# Patient Record
Sex: Male | Born: 1955 | Race: White | Hispanic: No | Marital: Married | Smoking: Never smoker
Health system: Southern US, Community
[De-identification: ages and names within clinical notes are randomized; demographics above are authoritative.]

## PROBLEM LIST (undated history)

## (undated) DIAGNOSIS — T7840XA Allergy, unspecified, initial encounter: Secondary | ICD-10-CM

## (undated) DIAGNOSIS — K219 Gastro-esophageal reflux disease without esophagitis: Secondary | ICD-10-CM

## (undated) DIAGNOSIS — N138 Other obstructive and reflux uropathy: Secondary | ICD-10-CM

## (undated) DIAGNOSIS — N401 Enlarged prostate with lower urinary tract symptoms: Secondary | ICD-10-CM

## (undated) HISTORY — DX: Benign prostatic hyperplasia with lower urinary tract symptoms: N40.1

## (undated) HISTORY — DX: Gastro-esophageal reflux disease without esophagitis: K21.9

## (undated) HISTORY — PX: WISDOM TOOTH EXTRACTION: SHX21

## (undated) HISTORY — PX: TONSILLECTOMY: SUR1361

## (undated) HISTORY — DX: Allergy, unspecified, initial encounter: T78.40XA

## (undated) HISTORY — DX: Benign prostatic hyperplasia with lower urinary tract symptoms: N13.8

## (undated) HISTORY — PX: COLONOSCOPY: SHX174

---

## 2003-08-11 HISTORY — PX: TURP VAPORIZATION: SUR1397

## 2011-05-28 ENCOUNTER — Telehealth: Payer: Self-pay | Admitting: Family Medicine

## 2011-05-28 ENCOUNTER — Ambulatory Visit (INDEPENDENT_AMBULATORY_CARE_PROVIDER_SITE_OTHER): Payer: 59 | Admitting: Family Medicine

## 2011-05-28 ENCOUNTER — Encounter: Payer: Self-pay | Admitting: Family Medicine

## 2011-05-28 DIAGNOSIS — L989 Disorder of the skin and subcutaneous tissue, unspecified: Secondary | ICD-10-CM

## 2011-05-28 DIAGNOSIS — M62838 Other muscle spasm: Secondary | ICD-10-CM

## 2011-05-28 DIAGNOSIS — J069 Acute upper respiratory infection, unspecified: Secondary | ICD-10-CM

## 2011-05-28 DIAGNOSIS — N429 Disorder of prostate, unspecified: Secondary | ICD-10-CM | POA: Insufficient documentation

## 2011-05-28 DIAGNOSIS — Z0000 Encounter for general adult medical examination without abnormal findings: Secondary | ICD-10-CM

## 2011-05-28 DIAGNOSIS — J309 Allergic rhinitis, unspecified: Secondary | ICD-10-CM

## 2011-05-28 LAB — CBC WITH DIFFERENTIAL/PLATELET
Basophils Absolute: 0 10*3/uL (ref 0.0–0.1)
Basophils Relative: 0 % (ref 0–1)
Eosinophils Relative: 3 % (ref 0–5)
HCT: 47.7 % (ref 39.0–52.0)
Lymphocytes Relative: 10 % — ABNORMAL LOW (ref 12–46)
MCH: 32.3 pg (ref 26.0–34.0)
MCHC: 32.9 g/dL (ref 30.0–36.0)
MCV: 98.1 fL (ref 78.0–100.0)
Monocytes Absolute: 0.6 10*3/uL (ref 0.1–1.0)
RDW: 13.4 % (ref 11.5–15.5)

## 2011-05-28 LAB — COMPREHENSIVE METABOLIC PANEL
Albumin: 4.7 g/dL (ref 3.5–5.2)
Alkaline Phosphatase: 97 U/L (ref 39–117)
BUN: 20 mg/dL (ref 6–23)
Creat: 1.41 mg/dL — ABNORMAL HIGH (ref 0.50–1.35)
Glucose, Bld: 101 mg/dL — ABNORMAL HIGH (ref 70–99)
Potassium: 4.5 mEq/L (ref 3.5–5.3)
Total Bilirubin: 0.7 mg/dL (ref 0.3–1.2)

## 2011-05-28 LAB — LIPID PANEL
HDL: 54 mg/dL (ref >39)
LDL Cholesterol: 173 mg/dL — ABNORMAL HIGH (ref 0–99)
Triglycerides: 127 mg/dL (ref <150)

## 2011-05-28 MED ORDER — AZELASTINE HCL 0.1 % NA SOLN: 2.0000 | Freq: Two times a day (BID) | NASAL | Status: DC

## 2011-05-28 MED ORDER — PANTOPRAZOLE SODIUM 40 MG PO TBEC: 40.0000 mg | DELAYED_RELEASE_TABLET | Freq: Every day | ORAL | Status: DC

## 2011-05-28 MED ORDER — TAMSULOSIN HCL 0.4 MG PO CAPS: 0.4000 mg | ORAL_CAPSULE | Freq: Every day | ORAL | Status: DC

## 2011-05-28 NOTE — Patient Instructions (Addendum)
Patient has been on a PPI for [place] to renew it  t he will call with the name for [place] to continue prescribing it He will find a chiropracter to work on his neck and the muscle spasm Notify if URI gets worse Start flomax Evaluate skin lesions at PE one lesion he will need to return for removal

## 2011-05-28 NOTE — Telephone Encounter (Signed)
Pt's wife called and her husband (the pt) was seen this am and they were told to call back with the specific PPI med pt is on so we can send in to the pharmacy for the pt. Plan:  Pantoprazole 40 mg was entered in to the pt profile, and ordered and sent as #30/6 refills to the [facility].  Pt aware. Jarvis Newcomer, LPN [facility]

## 2011-05-28 NOTE — Progress Notes (Signed)
  Subjective:    Patient ID: Shawn Arellano, male    DOB: Mar 15, 1956, 55 y.o.   MRN: [id number]  HPI #1 reflux #2 Prostatic hypertrophy #3 musle spasm  #4 flu vaccination #5 skin lesions has had a skin lesion on neck removed that has returned Multiple skin lesions on back #6URI symptoms started 3 days ago accompanied w/nasal congestion and cough Review of Systems  HENT: Positive for congestion, rhinorrhea, postnasal drip and sinus pressure.   Eyes: Positive for pain.  Respiratory: Positive for cough.   Gastrointestinal:       Reflux  Genitourinary: Positive for urgency and enuresis.  Musculoskeletal: Positive for myalgias.       Neck stiffness  Skin: Positive for color change.   VS BP 134/93 T98.9 H 5 7  Wt 207    Objective:   Physical Exam  Constitutional: He is oriented to person, place, and time. He appears well-developed.  HENT:  Head: Normocephalic and atraumatic.  Neck: Neck supple.       Tenderness over the R trapezius muscle both bellies.  Neurological: He is alert and oriented to person, place, and time.  Skin: Skin is warm and dry.       Skin lesion on L side of neck  Psychiatric: He has a normal mood and affect.          Assessment & Plan:  #1 will continue on reflux medication #2 since leakage is starting will consider Flomax with the realization that at his PE even since the Flomax did not work before he had microwave ablation it may work now #3 gave Lobbyist or Chiropractor of his choice  #4 since URI will hold off flu vaccination #5 will need separate visit for skin lesion boipsy removal on neck  #6 IF not better by next week call may consider antibiotic at that time Due to prostatic hypertrophy will stay away from oral antihistamine  And will place on Astelin nasal spray may need to add flomax       Patient instructions  Patient has been on a PPI for [place] to renew it  t he will call with the name for [place] to continue prescribing it He  will find a chiropracter to work on his neck and the muscle spasm Notify if URI gets worse Start flomax Evaluate skin lesions at PE one lesion he will need to return for removal

## 2011-06-18 ENCOUNTER — Ambulatory Visit (INDEPENDENT_AMBULATORY_CARE_PROVIDER_SITE_OTHER): Payer: 59 | Admitting: Family Medicine

## 2011-06-18 ENCOUNTER — Encounter: Payer: Self-pay | Admitting: Family Medicine

## 2011-06-18 VITALS — BP 137/96 | HR 71 | Ht 67.0 in | Wt 210.0 lb

## 2011-06-18 DIAGNOSIS — N429 Disorder of prostate, unspecified: Secondary | ICD-10-CM

## 2011-06-18 DIAGNOSIS — R030 Elevated blood-pressure reading, without diagnosis of hypertension: Secondary | ICD-10-CM

## 2011-06-18 DIAGNOSIS — L989 Disorder of the skin and subcutaneous tissue, unspecified: Secondary | ICD-10-CM

## 2011-06-18 DIAGNOSIS — Z0000 Encounter for general adult medical examination without abnormal findings: Secondary | ICD-10-CM

## 2011-06-18 DIAGNOSIS — E785 Hyperlipidemia, unspecified: Secondary | ICD-10-CM

## 2011-06-18 DIAGNOSIS — Z23 Encounter for immunization: Secondary | ICD-10-CM

## 2011-06-18 LAB — POCT URINALYSIS DIPSTICK
Blood, UA: NEGATIVE
Glucose, UA: NEGATIVE
Spec Grav, UA: >=1.03
Urobilinogen, UA: 0.2

## 2011-06-18 NOTE — Progress Notes (Signed)
Subjective:    Patient ID: Shawn Arellano, male    DOB: 03-Aug-1956, 55 y.o.   MRN: [id number]  HPI  Physical examination  Review of Systems  Constitutional: Positive for unexpected weight change. Negative for activity change.  Gastrointestinal:       Reflux is under good control.  Genitourinary:       Has only taken the Max for one week still having some urinary incontinence that time and still feels some prosthetic enlargement. He did report when it when he took Proscar results were better but he is on Flomax for one week.  Skin:       Psrevious facial mole has returned and he also has some lesions on his back he is concerned with that are almost like large amounts skin tags or lipoma    History   Social History  . Marital Status: Married    Spouse Name: N/A    Number of Children: N/A  . Years of Education: N/A   Occupational History  . Not on file.   Social History Main Topics  . Smoking status: Never Smoker   . Smokeless tobacco: Never Used  . Alcohol Use: No  . Drug Use: No  . Sexually Active: Yes   Other Topics Concern  . Not on file   Social History Narrative  . No narrative on file   Family History  Problem Relation Age of Onset  . COPD Paternal Grandmother   . Asthma Paternal Grandmother    Past Medical History  Diagnosis Date  . Prostate hyperplasia, benign localized, with urinary obstruction    Past Surgical History  Procedure Date  . Turp vaporization         BP 137/96  Pulse 71  Ht 5\' 7"  (1.702 m)  Wt 210 lb (95.255 kg)  BMI 32.89 kg/m2  SpO2 95%  Objective:   Physical Exam  Constitutional: He is oriented to person, place, and time. He appears well-developed and well-nourished.  HENT:  Head: Normocephalic and atraumatic.  Right Ear: External ear normal.  Left Ear: External ear normal.  Eyes: Pupils are equal, round, and reactive to light.  Neck: Normal range of motion. Neck supple.  Cardiovascular: Normal rate and regular rhythm.   Exam reveals no friction rub.   No murmur heard. Pulmonary/Chest: Effort normal and breath sounds normal. No respiratory distress. He has no wheezes.  Abdominal: Soft. Hernia confirmed negative in the right inguinal area and confirmed negative in the left inguinal area.  Genitourinary: Testes normal and penis normal. Right testis shows no mass, no swelling and no tenderness. Left testis shows no mass, no swelling and no tenderness. Circumcised.  Musculoskeletal: Normal range of motion.  Lymphadenopathy:       Right: No inguinal adenopathy present.       Left: No inguinal adenopathy present.  Neurological: He is alert and oriented to person, place, and time. He has normal reflexes.  Skin: Skin is warm and dry.          Lesion left lower face but according to him has returned  Multiple skin like lesions that could be caused from skin tags lipoma on his right mid back  Psychiatric: He has a normal mood and affect. His behavior is normal.   Results for orders placed in visit on 06/18/11  POCT URINALYSIS DIPSTICK      Component Value Range   Color, UA yellow     Clarity, UA clear     Glucose, UA neg  Bilirubin, UA neg     Ketones, UA neg     Spec Grav, UA >=1.030     Blood, UA neg     pH, UA 6.0     Protein, UA 30     Urobilinogen, UA 0.2     Nitrite, UA neg     Leukocytes, UA neg     Hemoccult negative       Assessment & Plan:  #1 flu shot will be given if needed #2 Hyperlipidemia we'll recheck his cholesterol and lipids in 4 months stressed importance of exercise and diet and will recheck in 4 months #3 elevated blood pressure recheck in 4 months and make a decision when he needs to be on blood pressure medicine at that time. #4 prostatic enlargement with some obstruction symptoms continue Flomax if not better by December may call and we will switch ProStar should be noted PSA was fine.

## 2011-06-18 NOTE — Patient Instructions (Signed)
Hypertension As your heart beats, it forces blood through your arteries. This force is your blood pressure. If the pressure is too high, it is called hypertension (HTN) or high blood pressure. HTN is dangerous because you may have it and not know it. High blood pressure may mean that your heart has to work harder to pump blood. Your arteries may be narrow or stiff. The extra work puts you at risk for heart disease, stroke, and other problems.  Blood pressure consists of two numbers, a higher number over a lower, 110/72, for example. It is stated as "110 over 72." The ideal is below 120 for the top number (systolic) and under 80 for the bottom (diastolic). Write down your blood pressure today. You should pay close attention to your blood pressure if you have certain conditions such as:  Heart failure.   Prior heart attack.   Diabetes   Chronic kidney disease.   Prior stroke.   Multiple risk factors for heart disease.  To see if you have HTN, your blood pressure should be measured while you are seated with your arm held at the level of the heart. It should be measured at least twice. A one-time elevated blood pressure reading (especially in the Emergency Department) does not mean that you need treatment. There may be conditions in which the blood pressure is different between your right and left arms. It is important to see your caregiver soon for a recheck. Most people have essential hypertension which means that there is not a specific cause. This type of high blood pressure may be lowered by changing lifestyle factors such as:  Stress.   Smoking.   Lack of exercise.   Excessive weight.   Drug/tobacco/alcohol use.   Eating less salt.  Most people do not have symptoms from high blood pressure until it has caused damage to the body. Effective treatment can often prevent, delay or reduce that damage. TREATMENT  When a cause has been identified, treatment for high blood pressure is  directed at the cause. There are a large number of medications to treat HTN. These fall into several categories, and your caregiver will help you select the medicines that are best for you. Medications may have side effects. You should review side effects with your caregiver. If your blood pressure stays high after you have made lifestyle changes or started on medicines,   Your medication(s) may need to be changed.   Other problems may need to be addressed.   Be certain you understand your prescriptions, and know how and when to take your medicine.   Be sure to follow up with your caregiver within the time frame advised (usually within two weeks) to have your blood pressure rechecked and to review your medications.   If you are taking more than one medicine to lower your blood pressure, make sure you know how and at what times they should be taken. Taking two medicines at the same time can result in blood pressure that is too low.  SEEK IMMEDIATE MEDICAL CARE IF:  You develop a severe headache, blurred or changing vision, or confusion.   You have unusual weakness or numbness, or a faint feeling.   You have severe chest or abdominal pain, vomiting, or breathing problems.  MAKE SURE YOU:   Understand these instructions.   Will watch your condition.   Will get help right away if you are not doing well or get worse.  Document Released: 07/27/2005 Document Revised: 04/08/2011 Document Reviewed:   03/16/2008 [employer] Patient Information 2012 [employer].Hypertriglyceridemia     Diet for High blood levels of Triglycerides Most fats in food are triglycerides. Triglycerides in your blood are stored as fat in your body. High levels of triglycerides in your blood may put you at a greater risk for heart disease and stroke.  Normal triglyceride levels are less than 150 mg/dL. Borderline high levels are 150-199 mg/dl. High levels are 200 - 499 mg/dL, and very high triglyceride levels are greater  than 500 mg/dL. The decision to treat high triglycerides is generally based on the level. For people with borderline or high triglyceride levels, treatment includes weight loss and exercise. Drugs are recommended for people with very high triglyceride levels. Many people who need treatment for high triglyceride levels have metabolic syndrome. This syndrome is a collection of disorders that often include: insulin resistance, high blood pressure, blood clotting problems, high cholesterol and triglycerides. TESTING PROCEDURE FOR TRIGLYCERIDES  You should not eat 4 hours before getting your triglycerides measured. The normal range of triglycerides is between 10 and 250 milligrams per deciliter (mg/dl). Some people may have extreme levels (1000 or above), but your triglyceride level may be too high if it is above 150 mg/dl, depending on what other risk factors you have for heart disease.   People with high blood triglycerides may also have high blood cholesterol levels. If you have high blood cholesterol as well as high blood triglycerides, your risk for heart disease is probably greater than if you only had high triglycerides. High blood cholesterol is one of the main risk factors for heart disease.  CHANGING YOUR DIET  Your weight can affect your blood triglyceride level. If you are more than 20% above your ideal body weight, you may be able to lower your blood triglycerides by losing weight. Eating less and exercising regularly is the best way to combat this. Fat provides more calories than any other food. The best way to lose weight is to eat less fat. Only 30% of your total calories should come from fat. Less than 7% of your diet should come from saturated fat. A diet low in fat and saturated fat is the same as a diet to decrease blood cholesterol. By eating a diet lower in fat, you may lose weight, lower your blood cholesterol, and lower your blood triglyceride level.  Eating a diet low in fat, especially  saturated fat, may also help you lower your blood triglyceride level. Ask your dietitian to help you figure how much fat you can eat based on the number of calories your caregiver has prescribed for you.  Exercise, in addition to helping with weight loss may also help lower triglyceride levels.   Alcohol can increase blood triglycerides. You may need to stop drinking alcoholic beverages.   Too much carbohydrate in your diet may also increase your blood triglycerides. Some complex carbohydrates are necessary in your diet. These may include bread, rice, potatoes, other starchy vegetables and cereals.   Reduce "simple" carbohydrates. These may include pure sugars, candy, honey, and jelly without losing other nutrients. If you have the kind of high blood triglycerides that is affected by the amount of carbohydrates in your diet, you will need to eat less sugar and less high-sugar foods. Your caregiver can help you with this.   Adding 2-4 grams of fish oil (EPA+ DHA) may also help lower triglycerides. Speak with your caregiver before adding any supplements to your regimen.  Following the Diet  Maintain your ideal   weight. Your caregivers can help you with a diet. Generally, eating less food and getting more exercise will help you lose weight. Joining a weight control group may also help. Ask your caregivers for a good weight control group in your area.  Eat low-fat foods instead of high-fat foods. This can help you lose weight too.  These foods are lower in fat. Eat MORE of these:   Dried beans, peas, and lentils.   Egg whites.   Low-fat cottage cheese.   Fish.   Lean cuts of meat, such as round, sirloin, rump, and flank (cut extra fat off meat you fix).   Whole grain breads, cereals and pasta.   Skim and nonfat dry milk.   Low-fat yogurt.   Poultry without the skin.   Cheese made with skim or part-skim milk, such as mozzarella, parmesan, farmers', ricotta, or pot cheese.  These are  higher fat foods. Eat LESS of these:   Whole milk and foods made from whole milk, such as American, blue, cheddar, monterey jack, and swiss cheese   High-fat meats, such as luncheon meats, sausages, knockwurst, bratwurst, hot dogs, ribs, corned beef, ground pork, and regular ground beef.   Fried foods.  Limit saturated fats in your diet. Substituting unsaturated fat for saturated fat may decrease your blood triglyceride level. You will need to read package labels to know which products contain saturated fats.  These foods are high in saturated fat. Eat LESS of these:   Fried pork skins.   Whole milk.   Skin and fat from poultry.   Palm oil.   Butter.   Shortening.   Cream cheese.   Bacon.   Margarines and baked goods made from listed oils.   Vegetable shortenings.   Chitterlings.   Fat from meats.   Coconut oil.   Palm kernel oil.   Lard.   Cream.   Sour cream.   Fatback.   Coffee whiteners and non-dairy creamers made with these oils.   Cheese made from whole milk.  Use unsaturated fats (both polyunsaturated and monounsaturated) moderately. Remember, even though unsaturated fats are better than saturated fats; you still want a diet low in total fat.  These foods are high in unsaturated fat:   Canola oil.   Sunflower oil.   Mayonnaise.   Almonds.   Peanuts.   Pine nuts.   Margarines made with these oils.   Safflower oil.   Olive oil.   Avocados.   Cashews.   Peanut butter.   Sunflower seeds.   Soybean oil.   Peanut oil.   Olives.   Pecans.   Walnuts.   Pumpkin seeds.  Avoid sugar and other high-sugar foods. This will decrease carbohydrates without decreasing other nutrients. Sugar in your food goes rapidly to your blood. When there is excess sugar in your blood, your liver may use it to make more triglycerides. Sugar also contains calories without other important nutrients.  Eat LESS of these:   Sugar, brown sugar, powdered  sugar, jam, jelly, preserves, honey, syrup, molasses, pies, candy, cakes, cookies, frosting, pastries, colas, soft drinks, punches, fruit drinks, and regular gelatin.   Avoid alcohol. Alcohol, even more than sugar, may increase blood triglycerides. In addition, alcohol is high in calories and low in nutrients. Ask for sparkling water, or a diet soft drink instead of an alcoholic beverage.  Suggestions for planning and preparing meals   Bake, broil, grill or roast meats instead of frying.   Remove fat from meats and   skin from poultry before cooking.   Add spices, herbs, lemon juice or vinegar to vegetables instead of salt, rich sauces or gravies.   Use a non-stick skillet without fat or use no-stick sprays.   Cool and refrigerate stews and broth. Then remove the hardened fat floating on the surface before serving.   Refrigerate meat drippings and skim off fat to make low-fat gravies.   Serve more fish.   Use less butter, margarine and other high-fat spreads on bread or vegetables.   Use skim or reconstituted non-fat dry milk for cooking.   Cook with low-fat cheeses.   Substitute low-fat yogurt or cottage cheese for all or part of the sour cream in recipes for sauces, dips or congealed salads.   Use half yogurt/half mayonnaise in salad recipes.   Substitute evaporated skim milk for cream. Evaporated skim milk or reconstituted non-fat dry milk can be whipped and substituted for whipped cream in certain recipes.   Choose fresh fruits for dessert instead of high-fat foods such as pies or cakes. Fruits are naturally low in fat.  When Dining Out   Order low-fat appetizers such as fruit or vegetable juice, pasta with vegetables or tomato sauce.   Select clear, rather than cream soups.   Ask that dressings and gravies be served on the side. Then use less of them.   Order foods that are baked, broiled, poached, steamed, stir-fried, or roasted.   Ask for margarine instead of butter,  and use only a small amount.   Drink sparkling water, unsweetened tea or coffee, or diet soft drinks instead of alcohol or other sweet beverages.  QUESTIONS AND ANSWERS ABOUT OTHER FATS IN THE BLOOD: SATURATED FAT, TRANS FAT, AND CHOLESTEROL What is trans fat? Trans fat is a type of fat that is formed when vegetable oil is hardened through a process called hydrogenation. This process helps makes foods more solid, gives them shape, and prolongs their shelf life. Trans fats are also called hydrogenated or partially hydrogenated oils.  What do saturated fat, trans fat, and cholesterol in foods have to do with heart disease? Saturated fat, trans fat, and cholesterol in the diet all raise the level of LDL "bad" cholesterol in the blood. The higher the LDL cholesterol, the greater the risk for coronary heart disease (CHD). Saturated fat and trans fat raise LDL similarly.  What foods contain saturated fat, trans fat, and cholesterol? High amounts of saturated fat are found in animal products, such as fatty cuts of meat, chicken skin, and full-fat dairy products like butter, whole milk, cream, and cheese, and in tropical vegetable oils such as palm, palm kernel, and coconut oil. Trans fat is found in some of the same foods as saturated fat, such as vegetable shortening, some margarines (especially hard or stick margarine), crackers, cookies, baked goods, fried foods, salad dressings, and other processed foods made with partially hydrogenated vegetable oils. Small amounts of trans fat also occur naturally in some animal products, such as milk products, beef, and lamb. Foods high in cholesterol include liver, other organ meats, egg yolks, shrimp, and full-fat dairy products. How can I use the new food label to make heart-healthy food choices? Check the Nutrition Facts panel of the food label. Choose foods lower in saturated fat, trans fat, and cholesterol. For saturated fat and cholesterol, you can also use the  Percent Daily Value (%DV): 5% DV or less is low, and 20% DV or more is high. (There is no %DV for trans fat.) Use   the Nutrition Facts panel to choose foods low in saturated fat and cholesterol, and if the trans fat is not listed, read the ingredients and limit products that list shortening or hydrogenated or partially hydrogenated vegetable oil, which tend to be high in trans fat. POINTS TO REMEMBER: YOU NEED A LITTLE TLC (THERAPEUTIC LIFESTYLE CHANGES)  Discuss your risk for heart disease with your caregivers, and take steps to reduce risk factors.   Change your diet. Choose foods that are low in saturated fat, trans fat, and cholesterol.   Add exercise to your daily routine if it is not already being done. Participate in physical activity of moderate intensity, like brisk walking, for at least 30 minutes on most, and preferably all days of the week. No time? Break the 30 minutes into three, 10-minute segments during the day.   Stop smoking. If you do smoke, contact your caregiver to discuss ways in which they can help you quit.   Do not use street drugs.   Maintain a normal weight.   Maintain a healthy blood pressure.   Keep up with your blood work for checking the fats in your blood as directed by your caregiver.  Document Released: 05/14/2004 Document Revised: 04/08/2011 Document Reviewed: 12/10/2008 [employer] Patient Information 2012 [employer]. 

## 2011-07-09 ENCOUNTER — Telehealth: Payer: Self-pay | Admitting: *Deleted

## 2011-07-09 MED ORDER — FINASTERIDE 5 MG PO TABS: 5.0000 mg | ORAL_TABLET | Freq: Every day | ORAL | Status: DC

## 2011-07-09 NOTE — Telephone Encounter (Signed)
Pt's wife calls stating Pt is not getting any relief from the flomax so they would like to try proscar again. This was noted to be OK to do in last OV note so I sent 30 day supply to pharm.

## 2011-08-05 ENCOUNTER — Other Ambulatory Visit: Payer: Self-pay | Admitting: Family Medicine

## 2011-08-05 ENCOUNTER — Telehealth: Payer: Self-pay | Admitting: *Deleted

## 2011-08-05 MED ORDER — TERAZOSIN HCL 2 MG PO CAPS: 2.0000 mg | ORAL_CAPSULE | Freq: Every day | ORAL | Status: DC

## 2011-08-05 NOTE — Telephone Encounter (Signed)
Wife calls and states that the PRoscar that pt is on is not working. Should they give it a little more time or does the med need to be changed. Pt has been on the Proscar for 1 month now

## 2011-08-05 NOTE — Telephone Encounter (Signed)
Continue proscar and lets add terazosin. If not improvement in about 2 weeks then let me know.

## 2011-08-05 NOTE — Telephone Encounter (Signed)
Informed patient's wife of medication. MA, LPN

## 2011-08-18 ENCOUNTER — Encounter: Payer: Self-pay | Admitting: Physician Assistant

## 2011-08-18 ENCOUNTER — Ambulatory Visit (INDEPENDENT_AMBULATORY_CARE_PROVIDER_SITE_OTHER): Payer: 59 | Admitting: Physician Assistant

## 2011-08-18 VITALS — BP 150/92 | HR 67 | Temp 97.9°F | Ht 66.0 in | Wt 215.0 lb

## 2011-08-18 DIAGNOSIS — R030 Elevated blood-pressure reading, without diagnosis of hypertension: Secondary | ICD-10-CM

## 2011-08-18 DIAGNOSIS — H6590 Unspecified nonsuppurative otitis media, unspecified ear: Secondary | ICD-10-CM

## 2011-08-18 NOTE — Progress Notes (Signed)
  Subjective:    Patient ID: Alejandra Barna, male    DOB: 11-04-1955, 56 y.o.   MRN: [id number]  HPI Patient began to notice around christmas fullness in both ears. He has never had this before. He describes hearing a slight hum all the time and his ears itch. He denies ear pain,ear drainage, hearing loss, dizziness, fever, or recent sickness. He does have a history of allergies. He has to fly this week and concerned it will bother his ear even more.   Patient has not started any weight loss or diet/exercise plan in order to lower blood pressure.   Review of Systems     Objective:   Physical Exam  Constitutional: He is oriented to person, place, and time. He appears well-developed and well-nourished.  HENT:  Head: Normocephalic and atraumatic.  Mouth/Throat: Oropharynx is clear and moist. No oropharyngeal exudate.       Bilateral nares edematous with erythema. Slight retraction of TM of right ear. Air bubbles seen in TM of left ear.  Neck: Normal range of motion. Neck supple.  Cardiovascular: Normal rate, regular rhythm and normal heart sounds.   Pulmonary/Chest: Effort normal and breath sounds normal.  Neurological: He is alert and oriented to person, place, and time.  Psychiatric: He has a normal mood and affect. His behavior is normal.          Assessment & Plan:  Otitis Media with Effusion- Instructed patient to start using Astelin nasal spray which he has not used yet since was prescribed 3 months ago and to start taking an oral antihistamine such as Zyrtec or Claritin OTC. Advised to when flying chew gum to help equalize pressure. Follow up if symptoms worsen.   Elevated Blood Pressure- Discussed weight loss and diet. Dr. Thurmond Butts has given him 6 months to get blood pressure down before medication started. He reports he is going to start a weight loss and diet plan.

## 2011-08-18 NOTE — Patient Instructions (Signed)
Start Astelin nasal spray daily along with anti-histamine orally. When flying next week chew gum to equalize pressure in ear. Start on weight loss, diet/exercise plan to decrease blood pressure before next visit with Dr. Thurmond Butts.

## 2011-08-27 ENCOUNTER — Ambulatory Visit: Payer: Self-pay | Admitting: Family Medicine

## 2011-08-28 ENCOUNTER — Ambulatory Visit: Payer: 59 | Admitting: Physician Assistant

## 2011-08-31 ENCOUNTER — Other Ambulatory Visit: Payer: Self-pay | Admitting: *Deleted

## 2011-08-31 MED ORDER — TERAZOSIN HCL 2 MG PO CAPS: 2.0000 mg | ORAL_CAPSULE | Freq: Every day | ORAL | Status: DC

## 2011-08-31 MED ORDER — FINASTERIDE 5 MG PO TABS: 5.0000 mg | ORAL_TABLET | Freq: Every day | ORAL | Status: DC

## 2011-10-02 ENCOUNTER — Telehealth: Payer: Self-pay | Admitting: *Deleted

## 2011-10-02 ENCOUNTER — Other Ambulatory Visit: Payer: Self-pay | Admitting: *Deleted

## 2011-10-02 MED ORDER — CIPROFLOXACIN HCL 500 MG PO TABS: 500.0000 mg | ORAL_TABLET | Freq: Two times a day (BID) | ORAL | Status: DC

## 2011-10-02 MED ORDER — ATOVAQUONE-PROGUANIL HCL 250-100 MG PO TABS: 1.0000 | ORAL_TABLET | Freq: Every day | ORAL | Status: DC

## 2011-10-02 MED ORDER — PANTOPRAZOLE SODIUM 40 MG PO TBEC: 40.0000 mg | DELAYED_RELEASE_TABLET | Freq: Every day | ORAL | Status: DC

## 2011-10-02 NOTE — Telephone Encounter (Signed)
Wife calls and states that they will be going to [place] for 1-2 months. States that when they went for a year the last time they had to take Malarone when they were there. She would like to know if her and her husband should take this again while they are there. Please advise.

## 2011-10-02 NOTE — Telephone Encounter (Signed)
Wife also states that they need an rx for Cipro since they used it last time they were there.

## 2011-10-02 NOTE — Telephone Encounter (Signed)
rx sent for malarone and cipro.

## 2011-10-02 NOTE — Telephone Encounter (Signed)
Wife informed

## 2011-11-03 ENCOUNTER — Ambulatory Visit: Payer: 59 | Admitting: Family Medicine

## 2012-02-29 ENCOUNTER — Other Ambulatory Visit: Payer: Self-pay | Admitting: Family Medicine

## 2012-03-30 ENCOUNTER — Other Ambulatory Visit: Payer: Self-pay | Admitting: Family Medicine

## 2012-08-23 ENCOUNTER — Other Ambulatory Visit: Payer: Self-pay | Admitting: *Deleted

## 2012-08-23 MED ORDER — TERAZOSIN HCL 2 MG PO CAPS: 2.0000 mg | ORAL_CAPSULE | Freq: Every day | ORAL | Status: DC

## 2012-08-23 MED ORDER — FINASTERIDE 5 MG PO TABS: 5.0000 mg | ORAL_TABLET | Freq: Every day | ORAL | Status: DC

## 2012-08-24 ENCOUNTER — Other Ambulatory Visit: Payer: Self-pay | Admitting: *Deleted

## 2012-08-24 MED ORDER — FINASTERIDE 5 MG PO TABS: 5.0000 mg | ORAL_TABLET | Freq: Every day | ORAL | Status: DC

## 2012-08-24 MED ORDER — TERAZOSIN HCL 2 MG PO CAPS: 2.0000 mg | ORAL_CAPSULE | Freq: Every day | ORAL | Status: DC

## 2012-09-21 ENCOUNTER — Encounter: Payer: Self-pay | Admitting: Family Medicine

## 2012-09-21 ENCOUNTER — Ambulatory Visit (INDEPENDENT_AMBULATORY_CARE_PROVIDER_SITE_OTHER): Payer: 59 | Admitting: Family Medicine

## 2012-09-21 VITALS — BP 141/88 | HR 83 | Ht 67.0 in | Wt 212.0 lb

## 2012-09-21 DIAGNOSIS — R030 Elevated blood-pressure reading, without diagnosis of hypertension: Secondary | ICD-10-CM

## 2012-09-21 DIAGNOSIS — N400 Enlarged prostate without lower urinary tract symptoms: Secondary | ICD-10-CM

## 2012-09-21 DIAGNOSIS — IMO0001 Reserved for inherently not codable concepts without codable children: Secondary | ICD-10-CM

## 2012-09-21 DIAGNOSIS — Z0000 Encounter for general adult medical examination without abnormal findings: Secondary | ICD-10-CM

## 2012-09-21 DIAGNOSIS — I10 Essential (primary) hypertension: Secondary | ICD-10-CM

## 2012-09-21 LAB — LIPID PANEL
HDL: 51 mg/dL (ref >39)
LDL Cholesterol: 144 mg/dL — ABNORMAL HIGH (ref 0–99)
Total CHOL/HDL Ratio: 4.4 Ratio
Triglycerides: 138 mg/dL (ref <150)
VLDL: 28 mg/dL (ref 0–40)

## 2012-09-21 LAB — COMPLETE METABOLIC PANEL WITH GFR
Albumin: 4.4 g/dL (ref 3.5–5.2)
Alkaline Phosphatase: 85 U/L (ref 39–117)
BUN: 23 mg/dL (ref 6–23)
Calcium: 9.2 mg/dL (ref 8.4–10.5)
Chloride: 105 mEq/L (ref 96–112)
GFR, Est Non African American: 51 mL/min — ABNORMAL LOW
Glucose, Bld: 109 mg/dL — ABNORMAL HIGH (ref 70–99)
Potassium: 4.3 mEq/L (ref 3.5–5.3)
Sodium: 139 mEq/L (ref 135–145)
Total Protein: 6.4 g/dL (ref 6.0–8.3)

## 2012-09-21 MED ORDER — PANTOPRAZOLE SODIUM 40 MG PO TBEC: DELAYED_RELEASE_TABLET | ORAL | Status: DC

## 2012-09-21 NOTE — Patient Instructions (Addendum)
DASH Diet The DASH diet stands for "Dietary Approaches to Stop Hypertension." It is a healthy eating plan that has been shown to reduce high blood pressure (hypertension) in as little as 14 days, while also possibly providing other significant health benefits. These other health benefits include reducing the risk of breast cancer after menopause and reducing the risk of type 2 diabetes, heart disease, colon cancer, and stroke. Health benefits also include weight loss and slowing kidney failure in patients with chronic kidney disease.  DIET GUIDELINES  Limit salt (sodium). Your diet should contain less than 1500 mg of sodium daily.  Limit refined or processed carbohydrates. Your diet should include mostly whole grains. Desserts and added sugars should be used sparingly.  Include small amounts of heart-healthy fats. These types of fats include nuts, oils, and tub margarine. Limit saturated and trans fats. These fats have been shown to be harmful in the body. CHOOSING FOODS  The following food groups are based on a 2000 calorie diet. See your Registered Dietitian for individual calorie needs. Grains and Grain Products (6 to 8 servings daily)  Eat More Often: Whole-wheat bread, brown rice, whole-grain or wheat pasta, quinoa, popcorn without added fat or salt (air popped).  Eat Less Often: White bread, white pasta, white rice, cornbread. Vegetables (4 to 5 servings daily)  Eat More Often: Fresh, frozen, and canned vegetables. Vegetables may be raw, steamed, roasted, or grilled with a minimal amount of fat.  Eat Less Often/Avoid: Creamed or fried vegetables. Vegetables in a cheese sauce. Fruit (4 to 5 servings daily)  Eat More Often: All fresh, canned (in natural juice), or frozen fruits. Dried fruits without added sugar. One hundred percent fruit juice ( cup [237 mL] daily).  Eat Less Often: Dried fruits with added sugar. Canned fruit in light or heavy syrup. [employer], Fish, and Poultry (2  servings or less daily. One serving is 3 to 4 oz [85-114 g]).  Eat More Often: Ninety percent or leaner ground beef, tenderloin, sirloin. Round cuts of beef, chicken breast, [place] breast. All fish. Grill, bake, or broil your meat. Nothing should be fried.  Eat Less Often/Avoid: Fatty cuts of meat, [place], or chicken leg, thigh, or wing. Fried cuts of meat or fish. Dairy (2 to 3 servings)  Eat More Often: Low-fat or fat-free milk, low-fat plain or light yogurt, reduced-fat or part-skim cheese.  Eat Less Often/Avoid: Milk (whole, 2%).Whole milk yogurt. Full-fat cheeses. Nuts, Seeds, and Legumes (4 to 5 servings per week)  Eat More Often: All without added salt.  Eat Less Often/Avoid: Salted nuts and seeds, canned beans with added salt. Fats and Sweets (limited)  Eat More Often: Vegetable oils, tub margarines without trans fats, sugar-free gelatin. Mayonnaise and salad dressings.  Eat Less Often/Avoid: Coconut oils, palm oils, butter, stick margarine, cream, half and half, cookies, candy, pie. FOR MORE INFORMATION The Dash Diet Eating Plan: www.dashdiet.org Document Released: 07/16/2011 Document Revised: 10/19/2011 Document Reviewed: 07/16/2011 [facility] Patient Information 2013 [place], [place].    Please check on the date of your colonoscopy.

## 2012-09-21 NOTE — Progress Notes (Signed)
Subjective:    Patient ID: Shawn Arellano, male    DOB: [birth year], 58 y.o.   MRN: [id number]  HPI  Here for CPE today.   Last colonoscopy was 10 years ago. Thinks last Tdap was in 2011.  He will gets [place] those dates.  He has no specific concerns today. He does admit that he weighs more now in his life than he ever has. He also admits he's not been exercising or eating the best. He knows he needs to make some changes but just hasn't felt very motivated to do it.  Review of Systems Comprehensive review of systems is negative. No chest pain or short of breath or palpitations.  BP 141/88  Pulse 83  Ht 5\' 7"  (1.702 m)  Wt 212 lb (96.163 kg)  BMI 33.2 kg/m2    No Known Allergies  Past Medical History  Diagnosis Date  . Prostate hyperplasia, benign localized, with urinary obstruction     Past Surgical History  Procedure Laterality Date  . Turp vaporization  2005    History   Social History  . Marital Status: Married    Spouse Name: Gavin Pound    Number of Children: N/A  . Years of Education: N/A   Occupational History  . 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    Social History Main Topics  . Smoking status: Never Smoker   . Smokeless tobacco: Never Used  . Alcohol Use: No  . Drug Use: No  . Sexually Active: Yes -- Male partner(s)   Other Topics Concern  . Not on file   Social History Narrative   Some exercise.     Family History  Problem Relation Age of Onset  . COPD Paternal Grandmother   . Asthma Paternal Grandmother     Outpatient Encounter Prescriptions as of 09/21/2012  Medication Sig Dispense Refill  . finasteride (PROSCAR) 5 MG tablet Take 1 tablet (5 mg total) by mouth daily.  90 tablet  0  . pantoprazole (PROTONIX) 40 MG tablet TAKE 1 TABLET BY MOUTH ONCE A DAY  90 tablet  1  . terazosin (HYTRIN) 2 MG capsule Take 1 capsule (2 mg total) by mouth at bedtime.  90 capsule  0  . [DISCONTINUED] pantoprazole (PROTONIX) 40 MG tablet TAKE 1 TABLET BY MOUTH  ONCE A DAY  90 tablet  1  . azelastine (ASTELIN) 137 MCG/SPRAY nasal spray Place 2 sprays into the nose 2 (two) times daily. Use in each nostril as directed  30 mL  2  . [DISCONTINUED] atovaquone-proguanil (MALARONE) 250-100 MG TABS Take 1 tablet by mouth daily.  90 tablet  0   No facility-administered encounter medications on file as of 09/21/2012.          Objective:   Physical Exam  Constitutional: He is oriented to person, place, and time. He appears well-developed and well-nourished.  HENT:  Head: Normocephalic and atraumatic.  Right Ear: External ear normal.  Left Ear: External ear normal.  Nose: Nose normal.  Mouth/Throat: Oropharynx is clear and moist.  Eyes: Conjunctivae and EOM are normal. Pupils are equal, round, and reactive to light.  Neck: Normal range of motion. Neck supple. No thyromegaly present.  Cardiovascular: Normal rate, regular rhythm, normal heart sounds and intact distal pulses.   No carotid bruits.   Pulmonary/Chest: Effort normal and breath sounds normal.  Abdominal: Soft. Bowel sounds are normal. He exhibits no distension and no mass. There is no tenderness. There is no rebound and  no guarding.  Musculoskeletal: Normal range of motion.  Lymphadenopathy:    He has no cervical adenopathy.  Neurological: He is alert and oriented to person, place, and time. He has normal reflexes.  Skin: Skin is warm and dry.  Psychiatric: He has a normal mood and affect. His behavior is normal. Judgment and thought content normal.          Assessment & Plan:  CPE Keep up a regular exercise program and make sure you are eating a healthy diet Try to eat 4 servings of dairy a day, or if you are lactose intolerant take a calcium with vitamin D daily.  Your vaccines are up to date.   He will check on colonoscopy and call with the dates.  If due then we can place referral.    Hypertension-discussed diagnosis. Given handout on the DASH diet. We reviewed the importance of  regular exercise, healthy diet and low salt. I would like to see him back in one month. At that point his blood pressure still elevated we will need to start medication we discussed this today. I would like to give him a few weeks to really try to make some changes and CPK improve on his own.  Obesity-recommend regular exercise and weight loss. Please see above.  Benign prostatic hypertrophy-he had to have a tuna in the past and would like referral to a new urologist. He is still taking his medications regularly. I recommend Dr. Barron Alvine at [facility]. Referral placed.

## 2012-11-02 ENCOUNTER — Ambulatory Visit (INDEPENDENT_AMBULATORY_CARE_PROVIDER_SITE_OTHER): Payer: 59 | Admitting: Physician Assistant

## 2012-11-02 ENCOUNTER — Encounter: Payer: Self-pay | Admitting: Physician Assistant

## 2012-11-02 VITALS — BP 143/89 | HR 67 | Wt 212.0 lb

## 2012-11-02 DIAGNOSIS — I10 Essential (primary) hypertension: Secondary | ICD-10-CM

## 2012-11-02 DIAGNOSIS — R7301 Impaired fasting glucose: Secondary | ICD-10-CM

## 2012-11-02 NOTE — Patient Instructions (Addendum)
3 month goal weigh under 200 lbs. Low salt diet. Blood pressure under 140/90 with goal being under 130/90.   1.5 Gram Low Sodium Diet A 1.5 gram sodium diet restricts the amount of sodium in the diet to no more than 1.5 g or 1500 mg daily. The American Heart Association recommends Americans over the age of 15 to consume no more than 1500 mg of sodium each day to reduce the risk of developing high blood pressure. Research also shows that limiting sodium may reduce heart attack and stroke risk. Many foods contain sodium for flavor and sometimes as a preservative. When the amount of sodium in a diet needs to be low, it is important to know what to look for when choosing foods and drinks. The following includes some information and guidelines to help make it easier for you to adapt to a low sodium diet. QUICK TIPS  Do not add salt to food.  Avoid convenience items and fast food.  Choose unsalted snack foods.  Buy lower sodium products, often labeled as "lower sodium" or "no salt added."  Check food labels to learn how much sodium is in 1 serving.  When eating at a restaurant, ask that your food be prepared with less salt or none, if possible. READING FOOD LABELS FOR SODIUM INFORMATION The nutrition facts label is a good place to find how much sodium is in foods. Look for products with no more than 400 mg of sodium per serving. Remember that 1.5 g = 1500 mg. The food label may also list foods as:  Sodium-free: Less than 5 mg in a serving.  Very low sodium: 35 mg or less in a serving.  Low-sodium: 140 mg or less in a serving.  Light in sodium: 50% less sodium in a serving. For example, if a food that usually has 300 mg of sodium is changed to become light in sodium, it will have 150 mg of sodium.  Reduced sodium: 25% less sodium in a serving. For example, if a food that usually has 400 mg of sodium is changed to reduced sodium, it will have 300 mg of sodium. CHOOSING FOODS Grains  Avoid:  Salted crackers and snack items. Some cereals, including instant hot cereals. Bread stuffing and biscuit mixes. Seasoned rice or pasta mixes.  Choose: Unsalted snack items. Low-sodium cereals, oats, puffed wheat and rice, shredded wheat. English muffins and bread. Pasta. Meats  Avoid: Salted, canned, smoked, spiced, pickled meats, including fish and poultry. Bacon, ham, sausage, cold cuts, hot dogs, anchovies.  Choose: Low-sodium canned tuna and salmon. Fresh or frozen meat, poultry, and fish. Dairy  Avoid: Processed cheese and spreads. Cottage cheese. Buttermilk and condensed milk. Regular cheese.  Choose: Milk. Low-sodium cottage cheese. Yogurt. Sour cream. Low-sodium cheese. Fruits and Vegetables  Avoid: Regular canned vegetables. Regular canned tomato sauce and paste. Frozen vegetables in sauces. Olives. Rosita Fire. Relishes. Sauerkraut.  Choose: Low-sodium canned vegetables. Low-sodium tomato sauce and paste. Frozen or fresh vegetables. Fresh and frozen fruit. Condiments  Avoid: Canned and packaged gravies. Worcestershire sauce. Tartar sauce. Barbecue sauce. Soy sauce. Steak sauce. Ketchup. Onion, garlic, and table salt. Meat flavorings and tenderizers.  Choose: Fresh and dried herbs and spices. Low-sodium varieties of mustard and ketchup. Lemon juice. Tabasco sauce. Horseradish. SAMPLE 1.5 GRAM SODIUM MEAL PLAN Breakfast / Sodium (mg)  1 cup low-fat milk / 143 mg  1 whole-wheat English muffin / 240 mg  1 tbs heart-healthy margarine / 153 mg  1 hard-boiled egg / 139 mg  1 small orange / 0 mg Lunch / Sodium (mg)  1 cup raw carrots / 76 mg  2 tbs no salt added peanut butter / 5 mg  2 slices whole-wheat bread / 270 mg  1 tbs jelly / 6 mg   cup red grapes / 2 mg Dinner / Sodium (mg)  1 cup whole-wheat pasta / 2 mg  1 cup low-sodium tomato sauce / 73 mg  3 oz lean ground beef / 57 mg  1 small side salad (1 cup raw spinach leaves,  cup cucumber,  cup yellow bell  pepper) with 1 tsp olive oil and 1 tsp red wine vinegar / 25 mg Snack / Sodium (mg)  1 container low-fat vanilla yogurt / 107 mg  3 graham cracker squares / 127 mg Nutrient Analysis  Calories: 1745  Protein: 75 g  Carbohydrate: 237 g  Fat: 57 g  Sodium: 1425 mg Document Released: 07/27/2005 Document Revised: 10/19/2011 Document Reviewed: 10/28/2009 [facility] Patient Information 2013 [place], [place].

## 2012-11-02 NOTE — Progress Notes (Signed)
  Subjective:    Patient ID: Shawn Arellano, male    DOB: 11-25-1955, 57 y.o.   MRN: [id number]  HPI Patient presents to the clinic for BP recheck. Pt blood pressure has continued to run high at the last couple of visits. Per Dr. Linford Arnold notes she would like to start on medication if continues to run high. Pt reports he is not ready for this. He knows he can do better with diet and exercise. 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 At home has elminated ramon noodles, and decreased cupcakes and eating more fruit. Denies any CP, palpitations, HA's, vision changes, numbness and tingling. Not currently exercising.   Last blood work showed elevated glucose coming in to follow up on A1C. Denies any symptoms of diabetes with increase thrist, polydipsia, dry eyes.    Review of Systems     Objective:   Physical Exam  Constitutional: He is oriented to person, place, and time. He appears well-developed and well-nourished.  HENT:  Head: Normocephalic and atraumatic.  Cardiovascular: Normal rate, regular rhythm and normal heart sounds.   Pulmonary/Chest: Effort normal and breath sounds normal.  Neurological: He is alert and oriented to person, place, and time.  Skin: Skin is warm and dry.  Psychiatric: He has a normal mood and affect. His behavior is normal.          Assessment & Plan:  HTN- Pt does not want to start medication. He feels like he can do better. Discussed importance of BP control and prevention of kidney disease/Heart attack/Stroke. He is very aware. If not improving in 3 months he states he will go on medication. Goal is to be under 200 lbs and exercising at least 4 days a week and not eating fast food everyday on the road.   Elevated fasting glucose- A1C was 5.6. Dicussed with pt still in normal range but high normal for pre-diabetes. Pt aware that if he doesn't change diet these numbers will only get worse and he has the power  to prevent diabetes. Gave copy of diabetic diet. Discussed with pt about carbs also turning into sugar. Will continue to monitor.

## 2012-11-28 ENCOUNTER — Telehealth: Payer: Self-pay | Admitting: *Deleted

## 2012-11-28 ENCOUNTER — Encounter: Payer: Self-pay | Admitting: *Deleted

## 2012-11-28 ENCOUNTER — Other Ambulatory Visit: Payer: Self-pay | Admitting: Family Medicine

## 2012-11-28 ENCOUNTER — Encounter: Payer: Self-pay | Admitting: Family Medicine

## 2012-11-28 MED ORDER — TERAZOSIN HCL 2 MG PO CAPS: ORAL_CAPSULE | ORAL | Status: DC

## 2012-11-28 NOTE — Telephone Encounter (Signed)
Yes stay on it.  Ok to refill and keep appt in June.

## 2012-11-28 NOTE — Telephone Encounter (Signed)
Pt calls and wants to know if he needs to stay on the terazosin- he needs a refill. Barry Dienes, LPN

## 2013-02-02 ENCOUNTER — Other Ambulatory Visit: Payer: Self-pay | Admitting: *Deleted

## 2013-02-02 ENCOUNTER — Ambulatory Visit: Payer: 59 | Admitting: Family Medicine

## 2013-02-02 DIAGNOSIS — E785 Hyperlipidemia, unspecified: Secondary | ICD-10-CM

## 2013-02-20 ENCOUNTER — Other Ambulatory Visit: Payer: Self-pay | Admitting: Family Medicine

## 2013-02-21 ENCOUNTER — Other Ambulatory Visit: Payer: Self-pay | Admitting: *Deleted

## 2013-02-21 DIAGNOSIS — E785 Hyperlipidemia, unspecified: Secondary | ICD-10-CM

## 2013-02-21 MED ORDER — TERAZOSIN HCL 2 MG PO CAPS: ORAL_CAPSULE | ORAL | Status: DC

## 2013-02-27 LAB — LIPID PANEL
Cholesterol: 200 mg/dL (ref 0–200)
HDL: 44 mg/dL (ref >39)
Total CHOL/HDL Ratio: 4.5 Ratio
Triglycerides: 117 mg/dL (ref <150)
VLDL: 23 mg/dL (ref 0–40)

## 2013-03-03 ENCOUNTER — Encounter: Payer: Self-pay | Admitting: Family Medicine

## 2013-03-03 ENCOUNTER — Ambulatory Visit (INDEPENDENT_AMBULATORY_CARE_PROVIDER_SITE_OTHER): Payer: 59 | Admitting: Family Medicine

## 2013-03-03 VITALS — BP 130/80 | Wt 210.0 lb

## 2013-03-03 DIAGNOSIS — N289 Disorder of kidney and ureter, unspecified: Secondary | ICD-10-CM

## 2013-03-03 DIAGNOSIS — R030 Elevated blood-pressure reading, without diagnosis of hypertension: Secondary | ICD-10-CM

## 2013-03-03 DIAGNOSIS — N1831 Chronic kidney disease, stage 3a: Secondary | ICD-10-CM | POA: Insufficient documentation

## 2013-03-03 DIAGNOSIS — N400 Enlarged prostate without lower urinary tract symptoms: Secondary | ICD-10-CM

## 2013-03-03 LAB — BASIC METABOLIC PANEL WITH GFR
BUN: 21 mg/dL (ref 6–23)
Calcium: 9.5 mg/dL (ref 8.4–10.5)
Creat: 1.39 mg/dL — ABNORMAL HIGH (ref 0.50–1.35)
GFR, Est African American: 64 mL/min
Glucose, Bld: 84 mg/dL (ref 70–99)

## 2013-03-03 NOTE — Progress Notes (Signed)
  Subjective:    Patient ID: Shawn Arellano, male    DOB: 21-Sep-1955, 57 y.o.   MRN: [id number]  HPI Elevated BP - well controlled.  Pt denies chest pain, SOB, dizziness, or heart palpitations.  Taking meds as directed w/o problems.  Denies medication side effects.  Has lost 2 lbs.   Abnormal kidney function - need to recheck labs today.  BPH- dong well on terazosin. He is happy with the results. He is now only getting up once at night to urinate. He still urinates fairly frequently but has been impressed with the results. He is tolerating it well without any side effects including lightheadedness or dizziness.  Review of Systems     Objective:   Physical Exam  Constitutional: He is oriented to person, place, and time. He appears well-developed and well-nourished.  HENT:  Head: Normocephalic and atraumatic.  Cardiovascular: Normal rate, regular rhythm and normal heart sounds.   Pulmonary/Chest: Effort normal and breath sounds normal.  Neurological: He is alert and oriented to person, place, and time.  Skin: Skin is warm and dry.  Psychiatric: He has a normal mood and affect. His behavior is normal.          Assessment & Plan:  Elevated blood pressure-continue to monitor carefully. Followup in 6 months.  CKD- 3 - will recheck kidney function today. Follow every 6 months. We had a long discussion about his abnormal kidney function but it does appear to be stable over at least the last 2 years. Explained that this can often come from long-term high blood pressure, diabetes et Karie Soda. He doesn't have a formal diagnosis of high blood pressure or diabetes. We will need to follow this carefully.  BPH- doing well. Nightime urination is down to once a night.  Continue current regimen for now. Followup carefully. If not maximally controlled as he would like that we can certainly adjust his medication regimen. Lab Results  Component Value Date   PSA 0.62 09/21/2012   PSA 1.92 05/28/2011

## 2013-03-03 NOTE — Patient Instructions (Signed)
Shingles vaccine - check with insurance.

## 2013-03-08 ENCOUNTER — Telehealth: Payer: Self-pay | Admitting: *Deleted

## 2013-03-08 DIAGNOSIS — N289 Disorder of kidney and ureter, unspecified: Secondary | ICD-10-CM

## 2013-03-08 NOTE — Telephone Encounter (Signed)
Pt wife calls and states she thought pt was supposed to be scheduled for an ultrasound of kidneys.  Looked in labs and see where you wanted this done but need the order put in and put the [facility] Location due to his insurance.  I do not see an order anywhere for this.

## 2013-03-08 NOTE — Telephone Encounter (Signed)
Order placed

## 2013-03-16 ENCOUNTER — Ambulatory Visit (HOSPITAL_BASED_OUTPATIENT_CLINIC_OR_DEPARTMENT_OTHER)
Admission: RE | Admit: 2013-03-16 | Discharge: 2013-03-16 | Disposition: A | Discharge status: Home / Self Care | Payer: 59 | Source: Ambulatory Visit | Attending: Family Medicine | Admitting: Family Medicine

## 2013-03-16 ENCOUNTER — Encounter: Payer: Self-pay | Admitting: Family Medicine

## 2013-03-16 ENCOUNTER — Other Ambulatory Visit: Payer: Self-pay | Admitting: *Deleted

## 2013-03-16 ENCOUNTER — Other Ambulatory Visit: Payer: Self-pay | Admitting: Family Medicine

## 2013-03-16 DIAGNOSIS — N400 Enlarged prostate without lower urinary tract symptoms: Secondary | ICD-10-CM | POA: Insufficient documentation

## 2013-03-16 DIAGNOSIS — N289 Disorder of kidney and ureter, unspecified: Secondary | ICD-10-CM

## 2013-03-16 DIAGNOSIS — R944 Abnormal results of kidney function studies: Secondary | ICD-10-CM | POA: Insufficient documentation

## 2013-03-16 LAB — MICROALBUMIN / CREATININE URINE RATIO: Creatinine, Urine: 22 mg/dL

## 2013-03-17 ENCOUNTER — Other Ambulatory Visit: Payer: Self-pay | Admitting: Family Medicine

## 2013-03-17 DIAGNOSIS — N3289 Other specified disorders of bladder: Secondary | ICD-10-CM

## 2013-03-29 ENCOUNTER — Other Ambulatory Visit: Payer: Self-pay | Admitting: *Deleted

## 2013-03-29 MED ORDER — PANTOPRAZOLE SODIUM 40 MG PO TBEC: DELAYED_RELEASE_TABLET | ORAL | Status: DC

## 2013-04-14 ENCOUNTER — Other Ambulatory Visit: Payer: Self-pay | Admitting: Family Medicine

## 2013-05-23 ENCOUNTER — Telehealth: Payer: Self-pay | Admitting: *Deleted

## 2013-05-23 ENCOUNTER — Encounter: Payer: Self-pay | Admitting: Internal Medicine

## 2013-05-23 DIAGNOSIS — Z1211 Encounter for screening for malignant neoplasm of colon: Secondary | ICD-10-CM

## 2013-05-23 NOTE — Telephone Encounter (Signed)
Pt's wife called to get referral for colonoscopy placed.Loralee Pacas Antler

## 2013-05-25 ENCOUNTER — Ambulatory Visit (AMBULATORY_SURGERY_CENTER): Payer: Self-pay | Admitting: *Deleted

## 2013-05-25 VITALS — Ht 67.0 in | Wt 212.2 lb

## 2013-05-25 DIAGNOSIS — Z1211 Encounter for screening for malignant neoplasm of colon: Secondary | ICD-10-CM

## 2013-05-25 MED ORDER — MOVIPREP 100 G PO SOLR: 1.0000 | Freq: Once | ORAL | Status: DC

## 2013-05-25 NOTE — Progress Notes (Signed)
No egg or soy allergy. ewm No CPAP or home 02 use. ewm No issues with past sedation. ewm Pt declined emmi video.ewm

## 2013-05-26 ENCOUNTER — Encounter: Payer: Self-pay | Admitting: Internal Medicine

## 2013-05-30 ENCOUNTER — Telehealth: Payer: Self-pay | Admitting: Family Medicine

## 2013-05-30 MED ORDER — CIPROFLOXACIN HCL 500 MG PO TABS: 500.0000 mg | ORAL_TABLET | Freq: Two times a day (BID) | ORAL | Status: AC

## 2013-05-30 NOTE — Telephone Encounter (Signed)
Patient states he and his wife are going to [place] next week and said that he needs to know if you will call them in a script for cipro to have to take on the trip.  He said that you did it for them last year.  They use [facility] on [address].  thanks

## 2013-05-30 NOTE — Telephone Encounter (Signed)
Patient's wife notified of rx.

## 2013-05-30 NOTE — Telephone Encounter (Signed)
Let pt know, rx sent.

## 2013-06-29 ENCOUNTER — Encounter: Payer: Self-pay | Admitting: Internal Medicine

## 2013-06-29 ENCOUNTER — Ambulatory Visit (AMBULATORY_SURGERY_CENTER): Payer: 59 | Admitting: Internal Medicine

## 2013-06-29 VITALS — BP 120/77 | HR 70 | Temp 97.8°F | Resp 20 | Ht 67.0 in | Wt 212.0 lb

## 2013-06-29 DIAGNOSIS — Z1211 Encounter for screening for malignant neoplasm of colon: Secondary | ICD-10-CM

## 2013-06-29 MED ORDER — SODIUM CHLORIDE 0.9 % IV SOLN: 500.0000 mL | INTRAVENOUS | Status: DC

## 2013-06-29 NOTE — Progress Notes (Signed)
Patient did not experience any of the following events: a burn prior to discharge; a fall within the facility; wrong site/side/patient/procedure/implant event; or a hospital transfer or hospital admission upon discharge from the facility. (G8907) Patient did not have preoperative order for IV antibiotic SSI prophylaxis. ([id number])  

## 2013-06-29 NOTE — Progress Notes (Signed)
Lidocaine-40mg IV prior to Propofol InductionPropofol given over incremental dosages 

## 2013-06-29 NOTE — Patient Instructions (Signed)
YOU HAD AN ENDOSCOPIC PROCEDURE TODAY AT THE [facility]: Refer to the procedure report that was given to you for any specific questions about what was found during the examination.  If the procedure report does not answer your questions, please call your gastroenterologist to clarify.  If you requested that your care partner not be given the details of your procedure findings, then the procedure report has been included in a sealed envelope for you to review at your convenience later.  YOU SHOULD EXPECT: Some feelings of bloating in the abdomen. Passage of more gas than usual.  Walking can help get rid of the air that was put into your GI tract during the procedure and reduce the bloating. If you had a lower endoscopy (such as a colonoscopy or flexible sigmoidoscopy) you may notice spotting of blood in your stool or on the toilet paper. If you underwent a bowel prep for your procedure, then you may not have a normal bowel movement for a few days.  DIET: Your first meal following the procedure should be a light meal and then it is ok to progress to your normal diet.  A half-sandwich or bowl of soup is an example of a good first meal.  Heavy or fried foods are harder to digest and may make you feel nauseous or bloated.  Likewise meals heavy in dairy and vegetables can cause extra gas to form and this can also increase the bloating.  Drink plenty of fluids but you should avoid alcoholic beverages for 24 hours.  ACTIVITY: Your care partner should take you home directly after the procedure.  You should plan to take it easy, moving slowly for the rest of the day.  You can resume normal activity the day after the procedure however you should NOT DRIVE or use heavy machinery for 24 hours (because of the sedation medicines used during the test).    SYMPTOMS TO REPORT IMMEDIATELY: A gastroenterologist can be reached at any hour.  During normal business hours, 8:30 AM to 5:00 PM Monday through Friday,  call [phone].  After hours and on weekends, please call the GI answering service at [phone] who will take a message and have the physician on call contact you.   Following lower endoscopy (colonoscopy or flexible sigmoidoscopy):  Excessive amounts of blood in the stool  Significant tenderness or worsening of abdominal pains  Swelling of the abdomen that is new, acute  Fever of 100F or higher    FOLLOW UP: If any biopsies were taken you will be contacted by phone or by letter within the next 1-3 weeks.  Call your gastroenterologist if you have not heard about the biopsies in 3 weeks.  Our staff will call the home number listed on your records the next business day following your procedure to check on you and address any questions or concerns that you may have at that time regarding the information given to you following your procedure. This is a courtesy call and so if there is no answer at the home number and we have not heard from you through the emergency physician on call, we will assume that you have returned to your regular daily activities without incident.  SIGNATURES/CONFIDENTIALITY: You and/or your care partner have signed paperwork which will be entered into your electronic medical record.  These signatures attest to the fact that that the information above on your After Visit Summary has been reviewed and is understood.  Full responsibility of the confidentiality   of this discharge information lies with you and/or your care-partner.  Normal colonoscopy.  Repeat in 10 years- 

## 2013-06-29 NOTE — Op Note (Signed)
[facility] [address] [place], [address]   COLONOSCOPY PROCEDURE REPORT  PATIENT: Shawn, Arellano  MR#: [id number] BIRTHDATE: 12/07/1955 , 57  yrs. old GENDER: Male ENDOSCOPIST: Roxy Cedar, MD REFERRED XB:[id number] Linford Arnold, M.D. PROCEDURE DATE:  06/29/2013 PROCEDURE:   Colonoscopy, screening First Screening Colonoscopy - Avg.  risk and is 50 yrs.  old or older - No.  Prior Negative Screening - Now for repeat screening. 10 or more years since last screening  History of Adenoma - Now for follow-up colonoscopy & has been > or = to 3 yrs.  N/A  Polyps Removed Today? No.  Recommend repeat exam, <10 yrs? No. ASA CLASS:   Class II INDICATIONS:average risk screening.   Negative exam 10 years ago [facility]) MEDICATIONS: MAC sedation, administered by CRNA and propofol (Diprivan) 300mg  IV  DESCRIPTION OF PROCEDURE:   After the risks benefits and alternatives of the procedure were thoroughly explained, informed consent was obtained.  A digital rectal exam revealed no abnormalities of the rectum.   The LB [id number]  endoscope was introduced through the anus and advanced to the cecum, which was identified by both the appendix and ileocecal valve. No adverse events experienced.   The quality of the prep was excellent, using MoviPrep  The instrument was then slowly withdrawn as the colon was fully examined.      COLON FINDINGS: A normal appearing cecum, ileocecal valve, and appendiceal orifice were identified.  The ascending, hepatic flexure, transverse, splenic flexure, descending, sigmoid colon and rectum appeared unremarkable.  No polyps or cancers were seen. Retroflexed views revealed internal hemorrhoids. The time to cecum=1 minutes 46 seconds.  Withdrawal time=10 minutes 42 seconds. The scope was withdrawn and the procedure completed. COMPLICATIONS: There were no complications.  ENDOSCOPIC IMPRESSION: Normal  colon  RECOMMENDATIONS: Continue current colorectal screening recommendations for "routine risk" patients with a repeat colonoscopy in 10 years.   eSigned:  Roxy Cedar, MD 06/29/2013 3:40 PM   cc: Nani Gasser, MD and The Patient

## 2013-06-30 ENCOUNTER — Telehealth: Payer: Self-pay | Admitting: *Deleted

## 2013-06-30 NOTE — Telephone Encounter (Signed)
  Follow up Call-  Call back number 06/29/2013  Post procedure Call Back phone  # [phone]  Permission to leave phone message Yes     Patient questions:  Do you have a fever, pain , or abdominal swelling? no Pain Score  0 *  Have you tolerated food without any problems? yes  Have you been able to return to your normal activities? no  Do you have any questions about your discharge instructions: Diet   no Medications  no Follow up visit  no  Do you have questions or concerns about your Care? no  Actions: * If pain score is 4 or above: No action needed, pain <4.

## 2013-07-04 ENCOUNTER — Encounter: Payer: Self-pay | Admitting: Family Medicine

## 2013-07-04 ENCOUNTER — Ambulatory Visit (INDEPENDENT_AMBULATORY_CARE_PROVIDER_SITE_OTHER): Payer: 59 | Admitting: Family Medicine

## 2013-07-04 VITALS — BP 135/78 | HR 85 | Temp 98.9°F | Ht 67.0 in | Wt 208.0 lb

## 2013-07-04 DIAGNOSIS — J209 Acute bronchitis, unspecified: Secondary | ICD-10-CM

## 2013-07-04 MED ORDER — HYDROCODONE-HOMATROPINE 5-1.5 MG/5ML PO SYRP: 5.0000 mL | ORAL_SOLUTION | Freq: Every evening | ORAL | Status: DC | PRN

## 2013-07-04 NOTE — Patient Instructions (Signed)
Acute Bronchitis Bronchitis is inflammation of the airways that extend from the windpipe into the lungs (bronchi). The inflammation often causes mucus to develop. This leads to a cough, which is the most common symptom of bronchitis.  In acute bronchitis, the condition usually develops suddenly and goes away over time, usually in a couple weeks. Smoking, allergies, and asthma can make bronchitis worse. Repeated episodes of bronchitis may cause further lung problems.  CAUSES Acute bronchitis is most often caused by the same virus that causes a cold. The virus can spread from person to person (contagious).  SIGNS AND SYMPTOMS   Cough.   Fever.   Coughing up mucus.   Body aches.   Chest congestion.   Chills.   Shortness of breath.   Sore throat.  DIAGNOSIS  Acute bronchitis is usually diagnosed through a physical exam. Tests, such as chest X-rays, are sometimes done to rule out other conditions.  TREATMENT  Acute bronchitis usually goes away in a couple weeks. Often times, no medical treatment is necessary. Medicines are sometimes given for relief of fever or cough. Antibiotics are usually not needed but may be prescribed in certain situations. In some cases, an inhaler may be recommended to help reduce shortness of breath and control the cough. A cool mist vaporizer may also be used to help thin bronchial secretions and make it easier to clear the chest.  HOME CARE INSTRUCTIONS  Get plenty of rest.   Drink enough fluids to keep your urine clear or pale yellow (unless you have a medical condition that requires fluid restriction). Increasing fluids may help thin your secretions and will prevent dehydration.   Only take over-the-counter or prescription medicines as directed by your health care provider.   Avoid smoking and secondhand smoke. Exposure to cigarette smoke or irritating chemicals will make bronchitis worse. If you are a smoker, consider using nicotine gum or skin  patches to help control withdrawal symptoms. Quitting smoking will help your lungs heal faster.   Reduce the chances of another bout of acute bronchitis by washing your hands frequently, avoiding people with cold symptoms, and trying not to touch your hands to your mouth, nose, or eyes.   Follow up with your health care provider as directed.  SEEK MEDICAL CARE IF: Your symptoms do not improve after 1 week of treatment.  SEEK IMMEDIATE MEDICAL CARE IF:  You develop an increased fever or chills.   You have chest pain.   You have severe shortness of breath.  You have bloody sputum.   You develop dehydration.  You develop fainting.  You develop repeated vomiting.  You develop a severe headache. MAKE SURE YOU:   Understand these instructions.  Will watch your condition.  Will get help right away if you are not doing well or get worse. Document Released: 09/03/2004 Document Revised: 03/29/2013 Document Reviewed: 01/17/2013 Exit[employer] Patient Information 2014 [employer]. Acute Bronchitis Bronchitis is inflammation of the airways that extend from the windpipe into the lungs (bronchi). The inflammation often causes mucus to develop. This leads to a cough, which is the most common symptom of bronchitis.  In acute bronchitis, the condition usually develops suddenly and goes away over time, usually in a couple weeks. Smoking, allergies, and asthma can make bronchitis worse. Repeated episodes of bronchitis may cause further lung problems.  CAUSES Acute bronchitis is most often caused by the same virus that causes a cold. The virus can spread from person to person (contagious).  SIGNS AND SYMPTOMS     Cough.   Fever.   Coughing up mucus.   Body aches.   Chest congestion.   Chills.   Shortness of breath.   Sore throat.  DIAGNOSIS  Acute bronchitis is usually diagnosed through a physical exam. Tests, such as chest X-rays, are sometimes done to rule out other  conditions.  TREATMENT  Acute bronchitis usually goes away in a couple weeks. Often times, no medical treatment is necessary. Medicines are sometimes given for relief of fever or cough. Antibiotics are usually not needed but may be prescribed in certain situations. In some cases, an inhaler may be recommended to help reduce shortness of breath and control the cough. A cool mist vaporizer may also be used to help thin bronchial secretions and make it easier to clear the chest.  HOME CARE INSTRUCTIONS  Get plenty of rest.   Drink enough fluids to keep your urine clear or pale yellow (unless you have a medical condition that requires fluid restriction). Increasing fluids may help thin your secretions and will prevent dehydration.   Only take over-the-counter or prescription medicines as directed by your health care provider.   Avoid smoking and secondhand smoke. Exposure to cigarette smoke or irritating chemicals will make bronchitis worse. If you are a smoker, consider using nicotine gum or skin patches to help control withdrawal symptoms. Quitting smoking will help your lungs heal faster.   Reduce the chances of another bout of acute bronchitis by washing your hands frequently, avoiding people with cold symptoms, and trying not to touch your hands to your mouth, nose, or eyes.   Follow up with your health care provider as directed.  SEEK MEDICAL CARE IF: Your symptoms do not improve after 1 week of treatment.  SEEK IMMEDIATE MEDICAL CARE IF:  You develop an increased fever or chills.   You have chest pain.   You have severe shortness of breath.  You have bloody sputum.   You develop dehydration.  You develop fainting.  You develop repeated vomiting.  You develop a severe headache. MAKE SURE YOU:   Understand these instructions.  Will watch your condition.  Will get help right away if you are not doing well or get worse. Document Released: 09/03/2004 Document Revised:  03/29/2013 Document Reviewed: 01/17/2013 ExitCare Patient Information 2014 [employer].  

## 2013-07-04 NOTE — Progress Notes (Signed)
  Subjective:    Patient ID: Shawn Arellano, male    DOB: April 23, 1956, 57 y.o.   MRN: [id number]  HPI  Cough and congestion with productive sputum x 1 week. Thinks has been hving fever but hasn't checked it. Has ben taking robitussin. Mild ST on the right side.  Right ear feels clogged.  Some nasal congestion. occ feels SOB.  Not a lot of post nasal drip. No facial pain or pressure.    Review of Systems     Objective:   Physical Exam  Constitutional: He is oriented to person, place, and time. He appears well-developed and well-nourished.  HENT:  Head: Normocephalic and atraumatic.  Right Ear: External ear normal.  Left Ear: External ear normal.  Nose: Nose normal.  Mouth/Throat: Oropharynx is clear and moist.  TMs and canals are clear.   Eyes: Conjunctivae and EOM are normal. Pupils are equal, round, and reactive to light.  Neck: Neck supple. No thyromegaly present.  Cardiovascular: Normal rate and normal heart sounds.   Pulmonary/Chest: Effort normal and breath sounds normal.  Lymphadenopathy:    He has no cervical adenopathy.  Neurological: He is alert and oriented to person, place, and time.  Skin: Skin is warm and dry.  Psychiatric: He has a normal mood and affect.          Assessment & Plan:  Acute bronchitis - likely viral. Will hold off on any antibiotics. Given prescription for nighttime cough medicine. Can run humidifier at night. If she's not improving over the next week or 2 then please let me know. Or if he suddenly gets worse or feels that he is developing more sinus-type symptoms then please let me. Continue work on hydration.

## 2013-07-18 ENCOUNTER — Telehealth: Payer: Self-pay | Admitting: *Deleted

## 2013-07-18 DIAGNOSIS — R05 Cough: Secondary | ICD-10-CM

## 2013-07-18 MED ORDER — GUAIFENESIN-CODEINE 100-10 MG/5ML PO SYRP: 5.0000 mL | ORAL_SOLUTION | Freq: Every evening | ORAL | Status: DC | PRN

## 2013-07-18 NOTE — Telephone Encounter (Signed)
Ok, then recommend CXR.  Will place ordre. Ok to fill tussionex instead.

## 2013-07-18 NOTE — Telephone Encounter (Signed)
Pt's wife called stating that he is not getting worse but is also not getting better.  She's asking if he can have tussionex? The cough syrup you gave him didn't help & he became itchy so he stopped taking it.

## 2013-07-18 NOTE — Telephone Encounter (Signed)
Robitussin AC ordered instead due to itching from hycodan.  Pt's wife notified.

## 2013-08-07 ENCOUNTER — Other Ambulatory Visit: Payer: Self-pay | Admitting: Family Medicine

## 2013-10-23 ENCOUNTER — Other Ambulatory Visit: Payer: Self-pay | Admitting: Family Medicine

## 2013-10-24 ENCOUNTER — Encounter: Payer: Self-pay | Admitting: Sports Medicine

## 2013-10-24 ENCOUNTER — Ambulatory Visit (INDEPENDENT_AMBULATORY_CARE_PROVIDER_SITE_OTHER): Payer: 59 | Admitting: Sports Medicine

## 2013-10-24 ENCOUNTER — Ambulatory Visit (INDEPENDENT_AMBULATORY_CARE_PROVIDER_SITE_OTHER): Payer: 59

## 2013-10-24 VITALS — BP 137/90 | HR 83 | Ht 67.0 in | Wt 214.0 lb

## 2013-10-24 DIAGNOSIS — R05 Cough: Secondary | ICD-10-CM

## 2013-10-24 DIAGNOSIS — R0602 Shortness of breath: Secondary | ICD-10-CM

## 2013-10-24 DIAGNOSIS — K2100 Gastro-esophageal reflux disease with esophagitis, without bleeding: Secondary | ICD-10-CM

## 2013-10-24 DIAGNOSIS — R0989 Other specified symptoms and signs involving the circulatory and respiratory systems: Secondary | ICD-10-CM

## 2013-10-24 DIAGNOSIS — R509 Fever, unspecified: Secondary | ICD-10-CM

## 2013-10-24 DIAGNOSIS — R059 Cough, unspecified: Secondary | ICD-10-CM

## 2013-10-24 MED ORDER — METRONIDAZOLE 500 MG PO TABS: 500.0000 mg | ORAL_TABLET | Freq: Two times a day (BID) | ORAL | Status: AC

## 2013-10-24 MED ORDER — AZITHROMYCIN 250 MG PO TABS: ORAL_TABLET | ORAL | Status: DC

## 2013-10-24 MED ORDER — BENZONATATE 200 MG PO CAPS: 200.0000 mg | ORAL_CAPSULE | Freq: Three times a day (TID) | ORAL | Status: DC | PRN

## 2013-10-24 MED ORDER — PREDNISONE 50 MG PO TABS: 50.0000 mg | ORAL_TABLET | Freq: Every day | ORAL | Status: DC

## 2013-10-24 NOTE — Assessment & Plan Note (Signed)
Unfortunately I do think he has had an episode of aspiration, and now has likely developed some aspiration pneumonia. Chest x-ray, azithromycin, metronidazole.

## 2013-10-24 NOTE — Progress Notes (Signed)
  Subjective:    CC: Cough  HPI: This is a pleasant 58 year old male with a history of reflux esophagitis. 2 weeks ago he was on a plane, had an episode of acid reflux, and thinks he aspirated some of the fluid. Since then he's had a persistent cough, with some mild right basilar chest tightness, mild low-grade fevers subjectively. Symptoms are moderate, persistent. He does endorse that prior to this episode he did have some vague shortness of breath.  Past medical history, Surgical history, Family history not pertinant except as noted below, Social history, Allergies, and medications have been entered into the medical record, reviewed, and no changes needed.   Review of Systems: No fevers, chills, night sweats, weight loss, chest pain, or shortness of breath.   Objective:    General: Well Developed, well nourished, and in no acute distress.  Neuro: Alert and oriented x3, extra-ocular muscles intact, sensation grossly intact.  HEENT: Normocephalic, atraumatic, pupils equal round reactive to light, neck supple, no masses, no lymphadenopathy, thyroid nonpalpable. Oropharynx, nasopharynx, external ear canals are unremarkable. Skin: Warm and dry, no rashes. Cardiac: Regular rate and rhythm, no murmurs rubs or gallops, no lower extremity edema.  Respiratory: Bibasilar crackles. Not using accessory muscles, speaking in full sentences.  Chest x-ray shows left lower lobe infiltrate.  Impression and Recommendations:

## 2013-10-24 NOTE — Assessment & Plan Note (Addendum)
Although the acute issue is likely an aspiration pneumonia with persistent acid reflux, he does endorse persistent shortness of breath chronically. I would like him to return in 2-3 weeks for pre-and postbronchodilator spirometry with his PCP.  Chest x-ray does show left lower lobe infiltrate.

## 2013-11-27 ENCOUNTER — Other Ambulatory Visit: Payer: 59 | Admitting: Family Medicine

## 2014-02-26 ENCOUNTER — Other Ambulatory Visit: Payer: Self-pay | Admitting: Family Medicine

## 2014-03-29 ENCOUNTER — Ambulatory Visit: Payer: 59 | Admitting: Family Medicine

## 2014-04-17 ENCOUNTER — Other Ambulatory Visit: Payer: Self-pay

## 2014-04-17 MED ORDER — PANTOPRAZOLE SODIUM 40 MG PO TBEC: 40.0000 mg | DELAYED_RELEASE_TABLET | Freq: Every day | ORAL | Status: DC

## 2014-04-24 ENCOUNTER — Other Ambulatory Visit: Payer: Self-pay | Admitting: *Deleted

## 2014-04-24 MED ORDER — PANTOPRAZOLE SODIUM 40 MG PO TBEC: 40.0000 mg | DELAYED_RELEASE_TABLET | Freq: Every day | ORAL | Status: DC

## 2014-07-31 ENCOUNTER — Ambulatory Visit (INDEPENDENT_AMBULATORY_CARE_PROVIDER_SITE_OTHER): Payer: 59 | Admitting: Family Medicine

## 2014-07-31 ENCOUNTER — Encounter: Payer: Self-pay | Admitting: Family Medicine

## 2014-07-31 VITALS — BP 136/86 | HR 79 | Wt 229.0 lb

## 2014-07-31 DIAGNOSIS — D239 Other benign neoplasm of skin, unspecified: Secondary | ICD-10-CM

## 2014-07-31 DIAGNOSIS — D219 Benign neoplasm of connective and other soft tissue, unspecified: Secondary | ICD-10-CM

## 2014-07-31 MED ORDER — CEPHALEXIN 500 MG PO CAPS: 500.0000 mg | ORAL_CAPSULE | Freq: Four times a day (QID) | ORAL | Status: DC

## 2014-07-31 NOTE — Progress Notes (Signed)
   Subjective:    Patient ID: Shawn Arellano, male    DOB: 11-07-1955, 58 y.o.   MRN: [id number]  HPI Here for skin fibroma he says it has been there for years but he feels like in the last several months of [place] a lot larger and has noticed its wrapping on his closing getting sore. It previously did not bother him. He denies any itching or rash or redness around the lesion. He would like to have it removed and biopsied if possible.   Review of Systems     Objective:   Physical Exam  Pulmonary/Chest:    approx 1.2 mm skin colored fibroma near the right shoulder blade. Has smaller lesion around this one.  No rash or erythema.           Assessment & Plan:  Skin fibroma-elliptical excision performed today. Patient tolerated well. 4 sutures placed. Will follow-up in 10 days for suture removal. Since he is traveling over the next week will be out of state I did go ahead and give him a prescription for an antibiotic in case he notices any redness or drainage from the wound. He is to keep it covered with gauze for the next 2-3 days and apply a waterproof Band-Aid while in the shower. He should only need to do this for about 3-4 days and after that they can get wet. Just make sure to pat dry after shower. Recommend applying Vaseline daily.   Excisional Biopsy Procedure Note  Pre-operative Diagnosis: skin fibroid   Post-operative Diagnosis: same  Locations: right upper back.   Indications: irritation, change in size  Anesthesia: Lidocaine 1% with epinephrine without added sodium bicarbonate  Procedure Details  The risks, benefits, indications, potential complications, and alternatives were explained to the patient and informed consent obtained.  The lesion and surrounding area was given a sterile prep using chlorhexidine and draped in the usual sterile fashion. A scalpel was used to excise an elliptical area of skin approximately 1cm by 2cm. The wound was closed with 4-0 Prolene using  simple interrupted stitches. Antibiotic ointment and a sterile dressing applied. The specimen was sent for pathologic examination. The patient tolerated the procedure well.  EBL: minimal  Findings: await pathology  Condition: Stable  Complications:  None  Plan: 1. Instructed to keep the wound dry and covered for 24-48 hours and clean thereafter. 2. Warning signs of infection were reviewed.   3. Recommended that the patient use Naproxen and OTC acetaminophen as needed for pain.  4. Return for suture removal in 10 days.

## 2014-07-31 NOTE — Patient Instructions (Signed)
Keep wound covered with gauze for about 2-3 days.  Can apply vaseline daily until come in to get sutures removed.   Use water proof band-aid for the next 3-4 days in the shower. Then can pat dry after shower.   Follow up in 10-14 days for suture removal.

## 2014-08-01 NOTE — Addendum Note (Signed)
Addended by: Narda Rutherford on: 08/01/2014 08:50 AM   Modules accepted: Orders

## 2014-08-14 ENCOUNTER — Ambulatory Visit: Payer: 59 | Admitting: Family Medicine

## 2014-08-14 ENCOUNTER — Encounter: Payer: Self-pay | Admitting: Family Medicine

## 2014-08-14 ENCOUNTER — Ambulatory Visit (INDEPENDENT_AMBULATORY_CARE_PROVIDER_SITE_OTHER): Payer: Managed Care, Other (non HMO) | Admitting: Family Medicine

## 2014-08-14 VITALS — BP 144/88 | HR 76 | Ht 67.0 in | Wt 229.0 lb

## 2014-08-14 DIAGNOSIS — Z4802 Encounter for removal of sutures: Secondary | ICD-10-CM

## 2014-08-14 NOTE — Progress Notes (Signed)
   Subjective:    Patient ID: Shawn Arellano, male    DOB: 1956-06-19, 60 y.o.   MRN: [id number]  HPI 4 sutures were made from incision over her right upper back. Wound is healing very well. No active drainage. Incision looks clean. Continue to live os filling daily for the next couple weeks to improve scarring and healing. Call if any problems or sign of wound infection.   Review of Systems     Objective:   Physical Exam        Assessment & Plan:

## 2014-11-28 ENCOUNTER — Ambulatory Visit (INDEPENDENT_AMBULATORY_CARE_PROVIDER_SITE_OTHER): Payer: Managed Care, Other (non HMO) | Admitting: Family Medicine

## 2014-11-28 ENCOUNTER — Encounter: Payer: Self-pay | Admitting: Family Medicine

## 2014-11-28 VITALS — BP 138/87 | HR 65 | Ht 67.0 in | Wt 219.0 lb

## 2014-11-28 DIAGNOSIS — R229 Localized swelling, mass and lump, unspecified: Secondary | ICD-10-CM

## 2014-11-28 MED ORDER — PANTOPRAZOLE SODIUM 40 MG PO TBEC: 40.0000 mg | DELAYED_RELEASE_TABLET | Freq: Every day | ORAL | Status: DC

## 2014-11-28 NOTE — Progress Notes (Signed)
   Subjective:    Patient ID: Shawn Arellano, male    DOB: 1956/03/31, 59 y.o.   MRN: [id number]  HPI Initally noted a knot on the back of the right knee x 2 weeks. Initially it was tender and red. .  No known trauma or injury. Says it was sore along the tendon and the redenss was more in a line. Says has been taking IBU in additional to daily low dose ASA.  Says the redness is less but the knot is larger. Tenderness is better. He is worried at  His brother had a PE.    Review of Systems     Objective:   Physical Exam  Constitutional: He is oriented to person, place, and time. He appears well-developed and well-nourished.  HENT:  Head: Normocephalic and atraumatic.  Musculoskeletal:  Small 1 cm or palpable mobile nodule behind the left knee laterally. Just over the tendon. Just this light erythema just directly over the lesion. No streaking. No induration. Mildly tender on exam.  Neurological: He is alert and oriented to person, place, and time.  Skin: Skin is warm and dry.  Psychiatric: He has a normal mood and affect. His behavior is normal.          Assessment & Plan:  Knot on the back of the knee. It is directly over the tendon but I do not think it's attach. Feels much more mobile. It's either in the subcutaneous tissue or in the epidermis. I do not see an open pore. Also consider especially with the reported history of red streaking whether not it could've been a superficial thrombophlebitis. But it actually looks pretty good today with only minimal erythema less than a centimeter over top of the palpable nodule which is about a centimeter in size. Very round in shape and smooth in texture. Gave reassurance. If it's not resolving on its own in the next couple of weeks and consider seeing our sports medicine doctor to have an ultrasound to just make sure that is not attached to the tendon in any way. Avoid prolonged sitting.

## 2014-12-05 ENCOUNTER — Telehealth: Payer: Self-pay | Admitting: *Deleted

## 2014-12-05 DIAGNOSIS — R2241 Localized swelling, mass and lump, right lower limb: Secondary | ICD-10-CM

## 2014-12-05 NOTE — Telephone Encounter (Signed)
Pt's wife called and stated that the area on his leg is not getting any better. Spoke w/Dr. Madilyn Fireman she stated that we could order an [place] of this area and fax it. I lvm advising her to call back and let us know what they would like to do.Audelia Hives [place]

## 2014-12-06 NOTE — Telephone Encounter (Signed)
Spoke w/pt's wife she said that they will be in town May 9-11th and he could do the [place] then when they are here.Shawn Arellano [place]

## 2014-12-07 NOTE — Telephone Encounter (Signed)
Okay. Order placed. They will contact him and they can let imaging Department know what day and time is convenient for them.

## 2014-12-18 ENCOUNTER — Ambulatory Visit: Payer: Managed Care, Other (non HMO)

## 2014-12-18 ENCOUNTER — Other Ambulatory Visit: Payer: Self-pay | Admitting: Family Medicine

## 2014-12-18 DIAGNOSIS — R2241 Localized swelling, mass and lump, right lower limb: Secondary | ICD-10-CM

## 2014-12-18 DIAGNOSIS — R2242 Localized swelling, mass and lump, left lower limb: Secondary | ICD-10-CM

## 2014-12-19 ENCOUNTER — Telehealth: Payer: Self-pay | Admitting: *Deleted

## 2014-12-27 NOTE — Telephone Encounter (Signed)
Shawn Arellano, Shawn Arellano

## 2015-02-21 ENCOUNTER — Other Ambulatory Visit: Payer: Self-pay

## 2015-02-21 MED ORDER — PANTOPRAZOLE SODIUM 40 MG PO TBEC: 40.0000 mg | DELAYED_RELEASE_TABLET | Freq: Every day | ORAL | Status: DC

## 2015-02-21 MED ORDER — TERAZOSIN HCL 2 MG PO CAPS
2.0000 mg | ORAL_CAPSULE | Freq: Every day | ORAL | Status: DC
Start: 2015-02-21 — End: 2015-05-27

## 2015-03-01 IMAGING — US US RENAL
1 series · 14 of 25 positions shown · non-contrast
Comparison: None.

CLINICAL DATA: Abnormal renal function study.  Benign prostatic
hypertrophy post TURP.

RENAL/URINARY TRACT ULTRASOUND COMPLETE

[Series 1: us renal · 0.22mm/px · 14 of 49 slices shown]
[im 1/49]
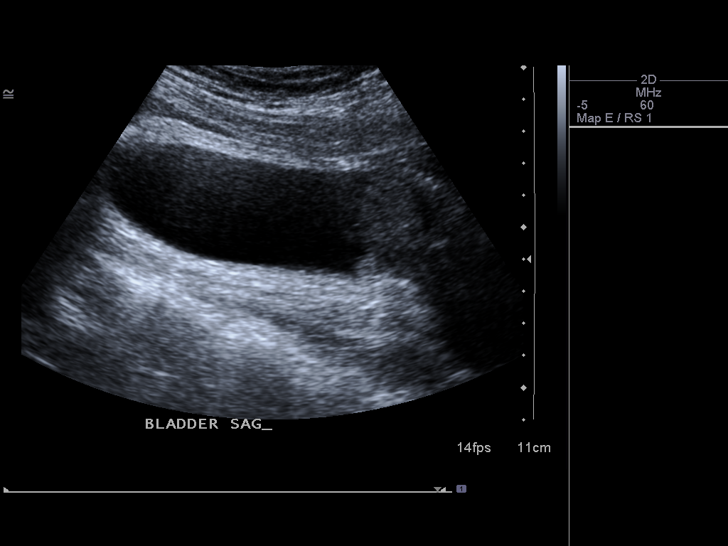
[im 5/49]
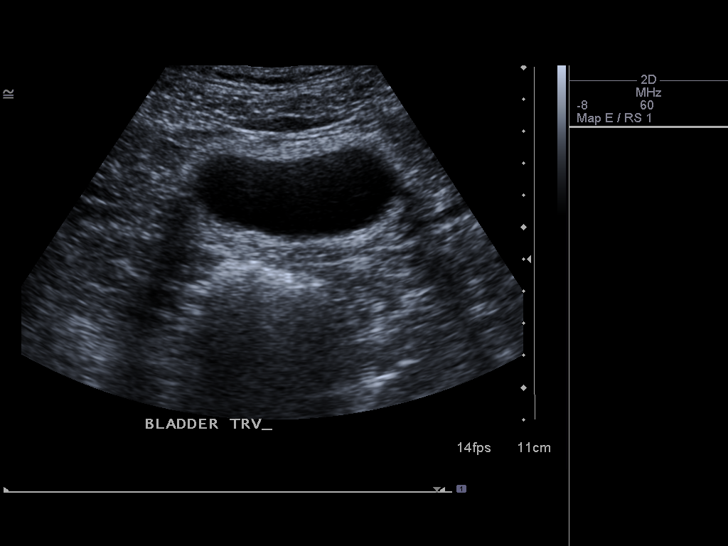
[im 9/49]
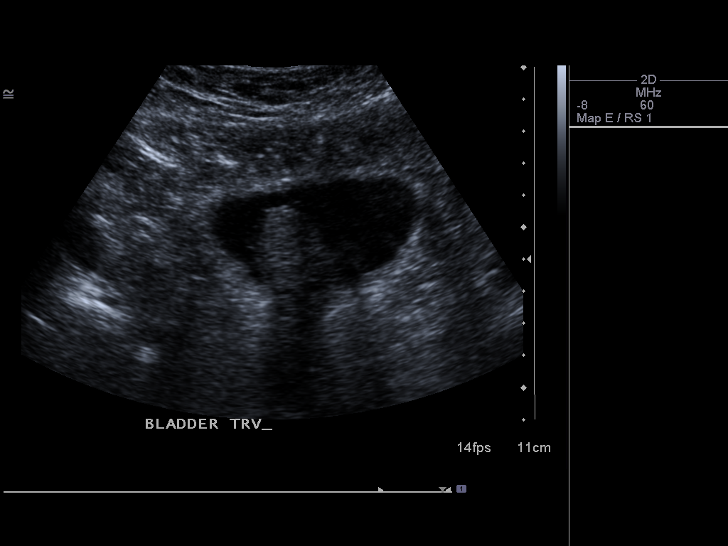
[im 13/49]
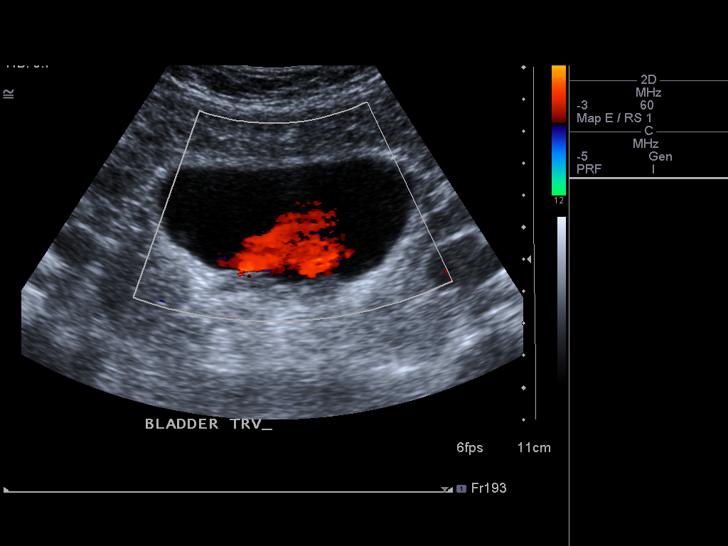
[im 17/49]
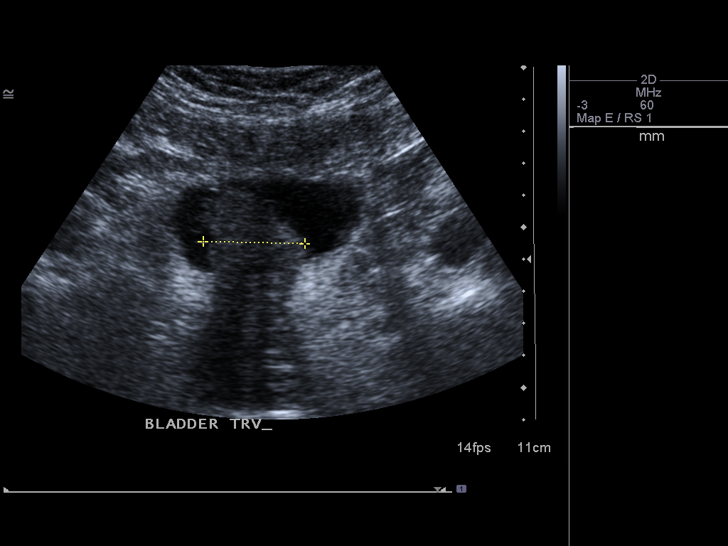
[im 19/49]
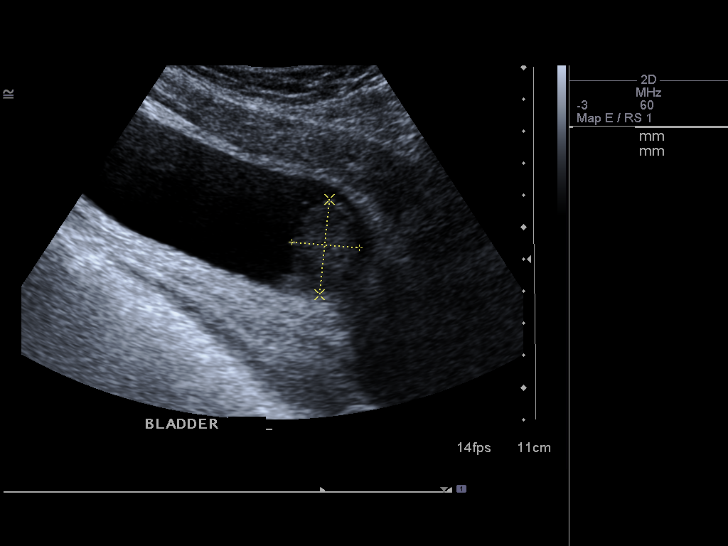
[im 23/49]
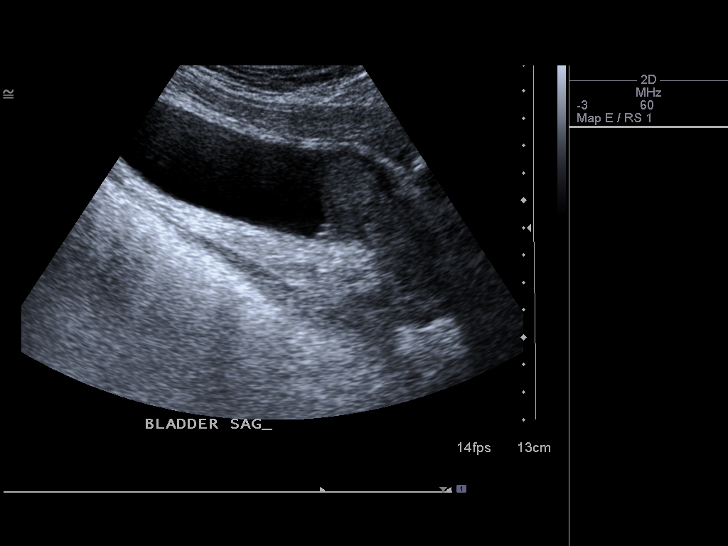
[im 27/49]
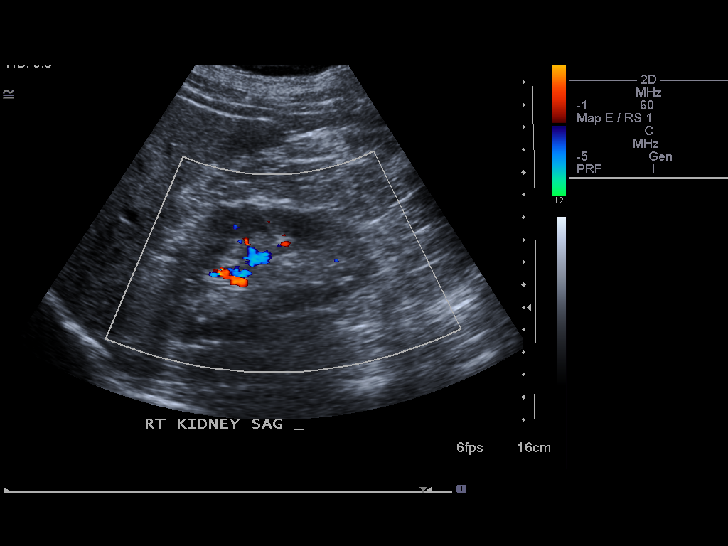
[im 31/49]
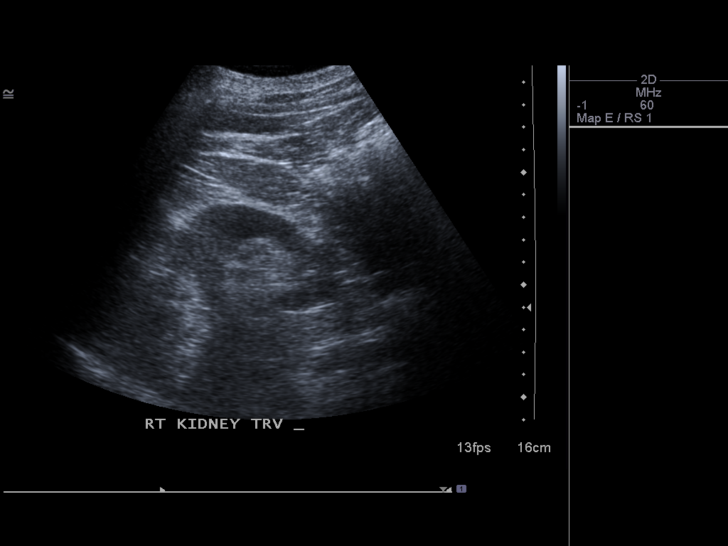
[im 33/49]
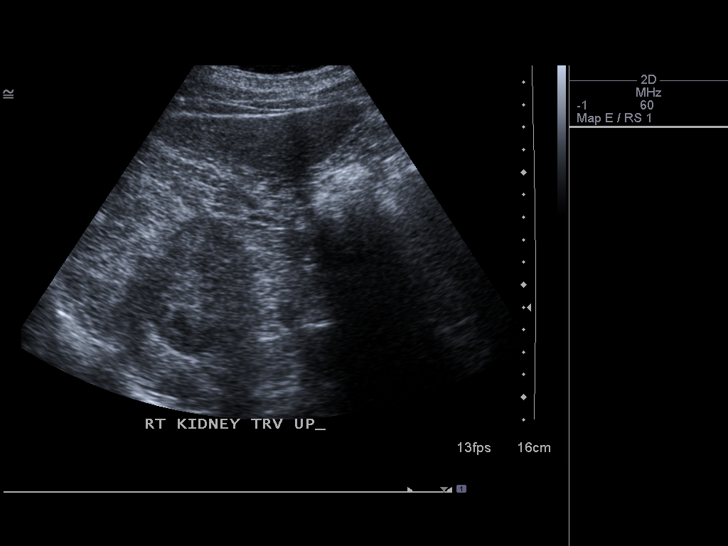
[im 37/49]
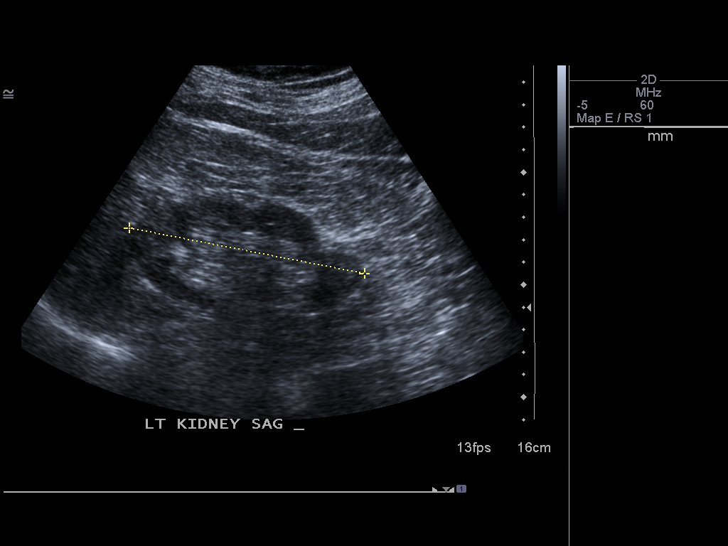
[im 41/49]
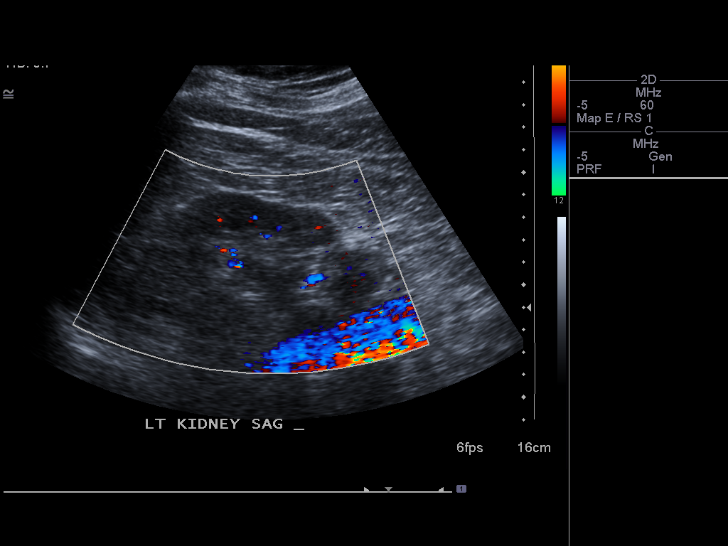
[im 45/49]
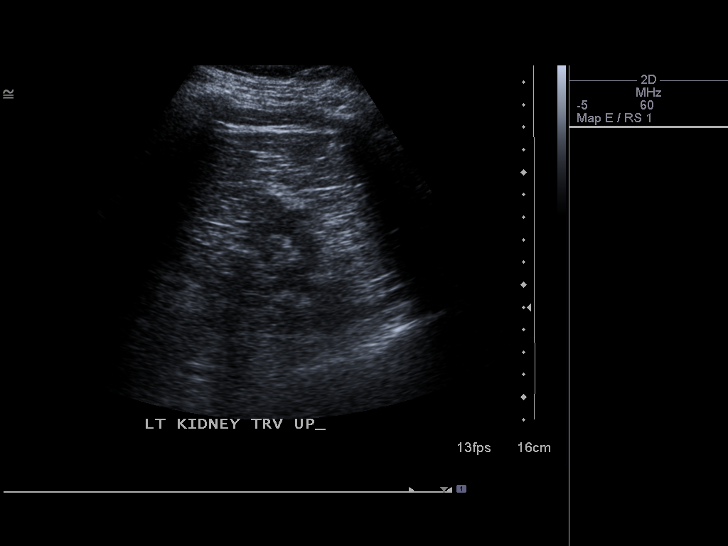
[im 49/49]
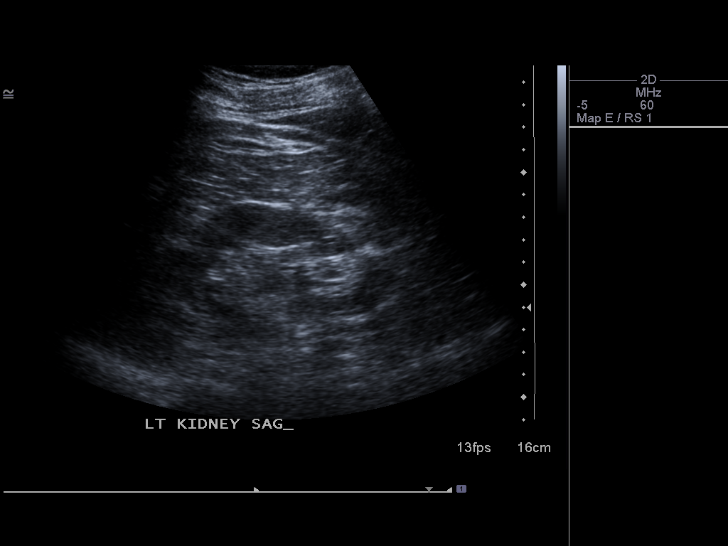

[14 of 25 positions shown; findings below may reference images not displayed]

FINDINGS: Right Kidney:  10.2 cm. No hydronephrosis or renal mass..

Left Kidney:  10.7 cm.  No hydronephrosis.  Cortical thinning lower
pole region suggestive of prior scarring.  No discrete mass noted.

Bladder:  Abnormality at the bladder base measures up to 3.2 cm.
Although it is possible this represents prostate gland extending
into the bladder base, the borders raise possibility of primary
bladder mass.
IMPRESSION: Abnormality at the bladder base measures up to 3.2 cm.  Although it
is possible this represents prostate gland extending into the
bladder base, the contour of the borders raises possibility of
primary bladder mass.

No hydronephrosis.

Cortical thinning left lower pole suggestive of scarring.

This has been B[place] [place] report utilizing dashboard call
feature..

## 2015-05-27 ENCOUNTER — Other Ambulatory Visit: Payer: Self-pay | Admitting: Family Medicine

## 2015-10-09 IMAGING — CR DG CHEST 2V
2 series · 2 of 2 positions shown · non-contrast
Comparison: None.

CLINICAL DATA: 57-year-old male with cough fever and congestion.
Shortness of breath. Abnormal lung base auscultation. Initial
encounter.

EXAM:
CHEST  2 VIEW

[view not recorded (1 of 2)]
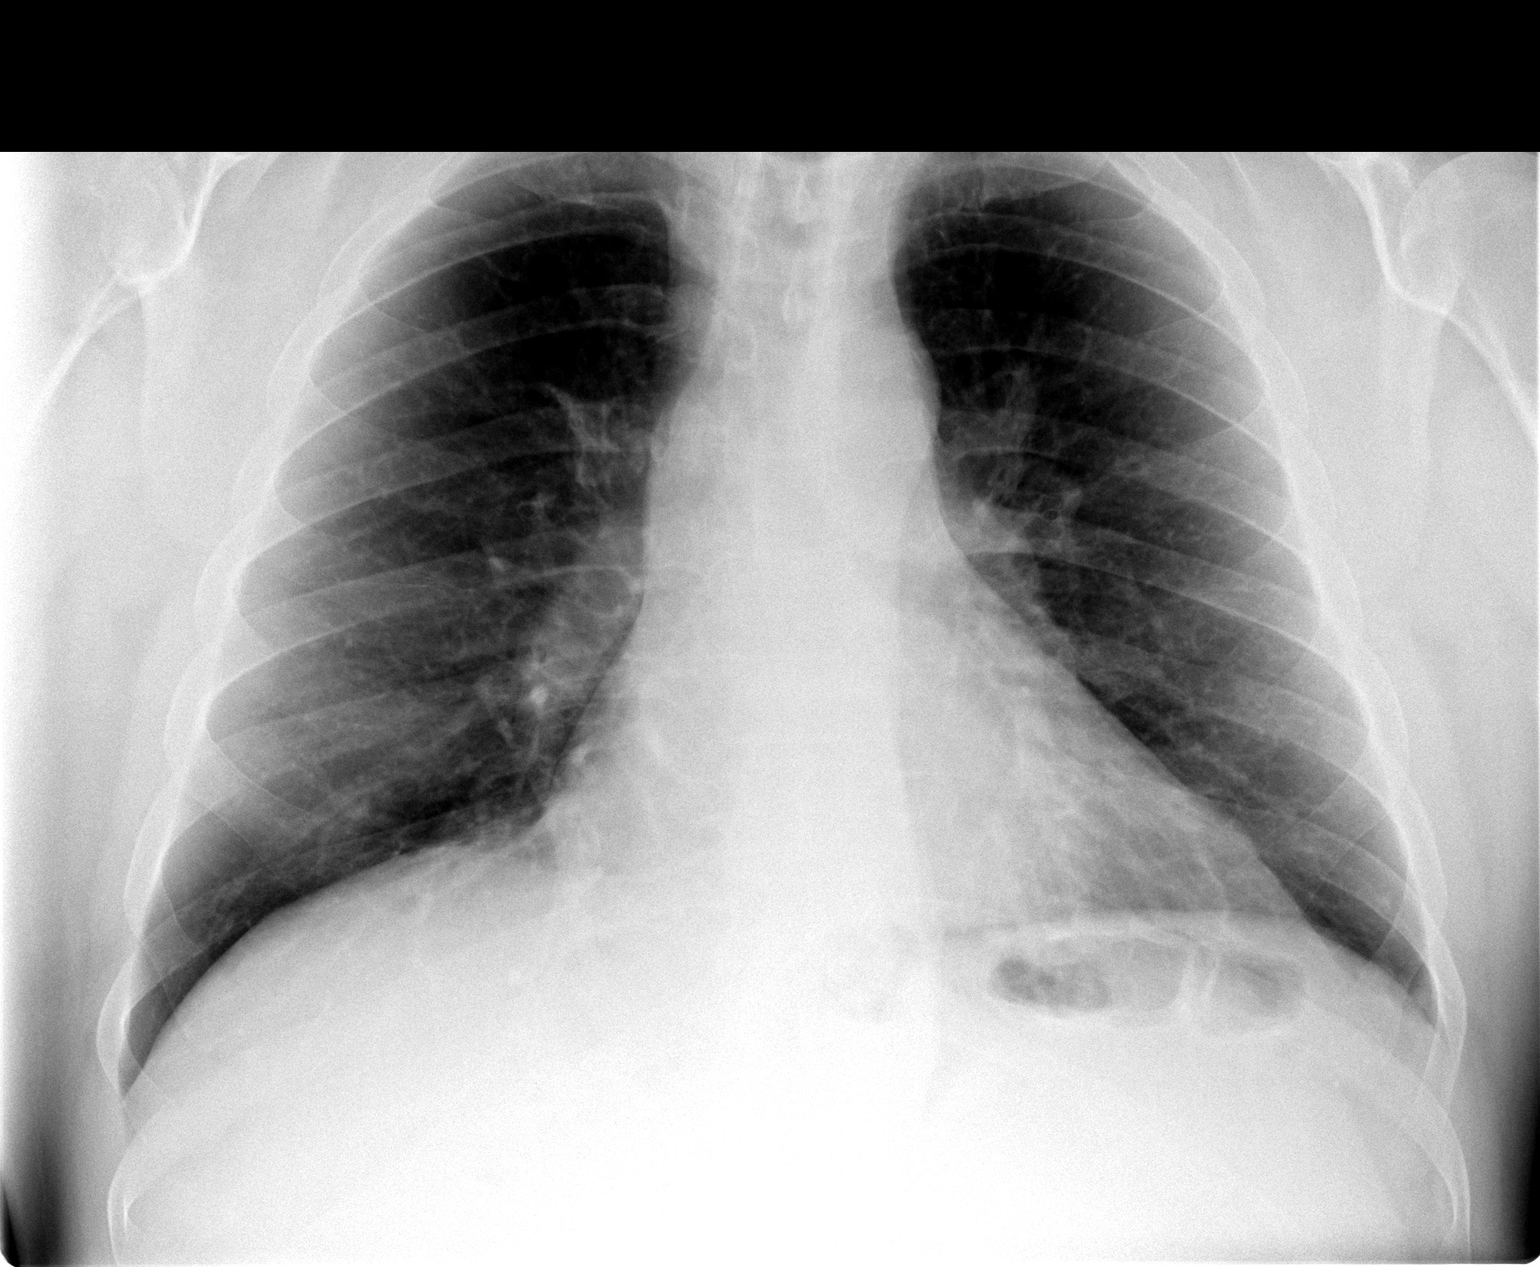

[view not recorded (2 of 2)]
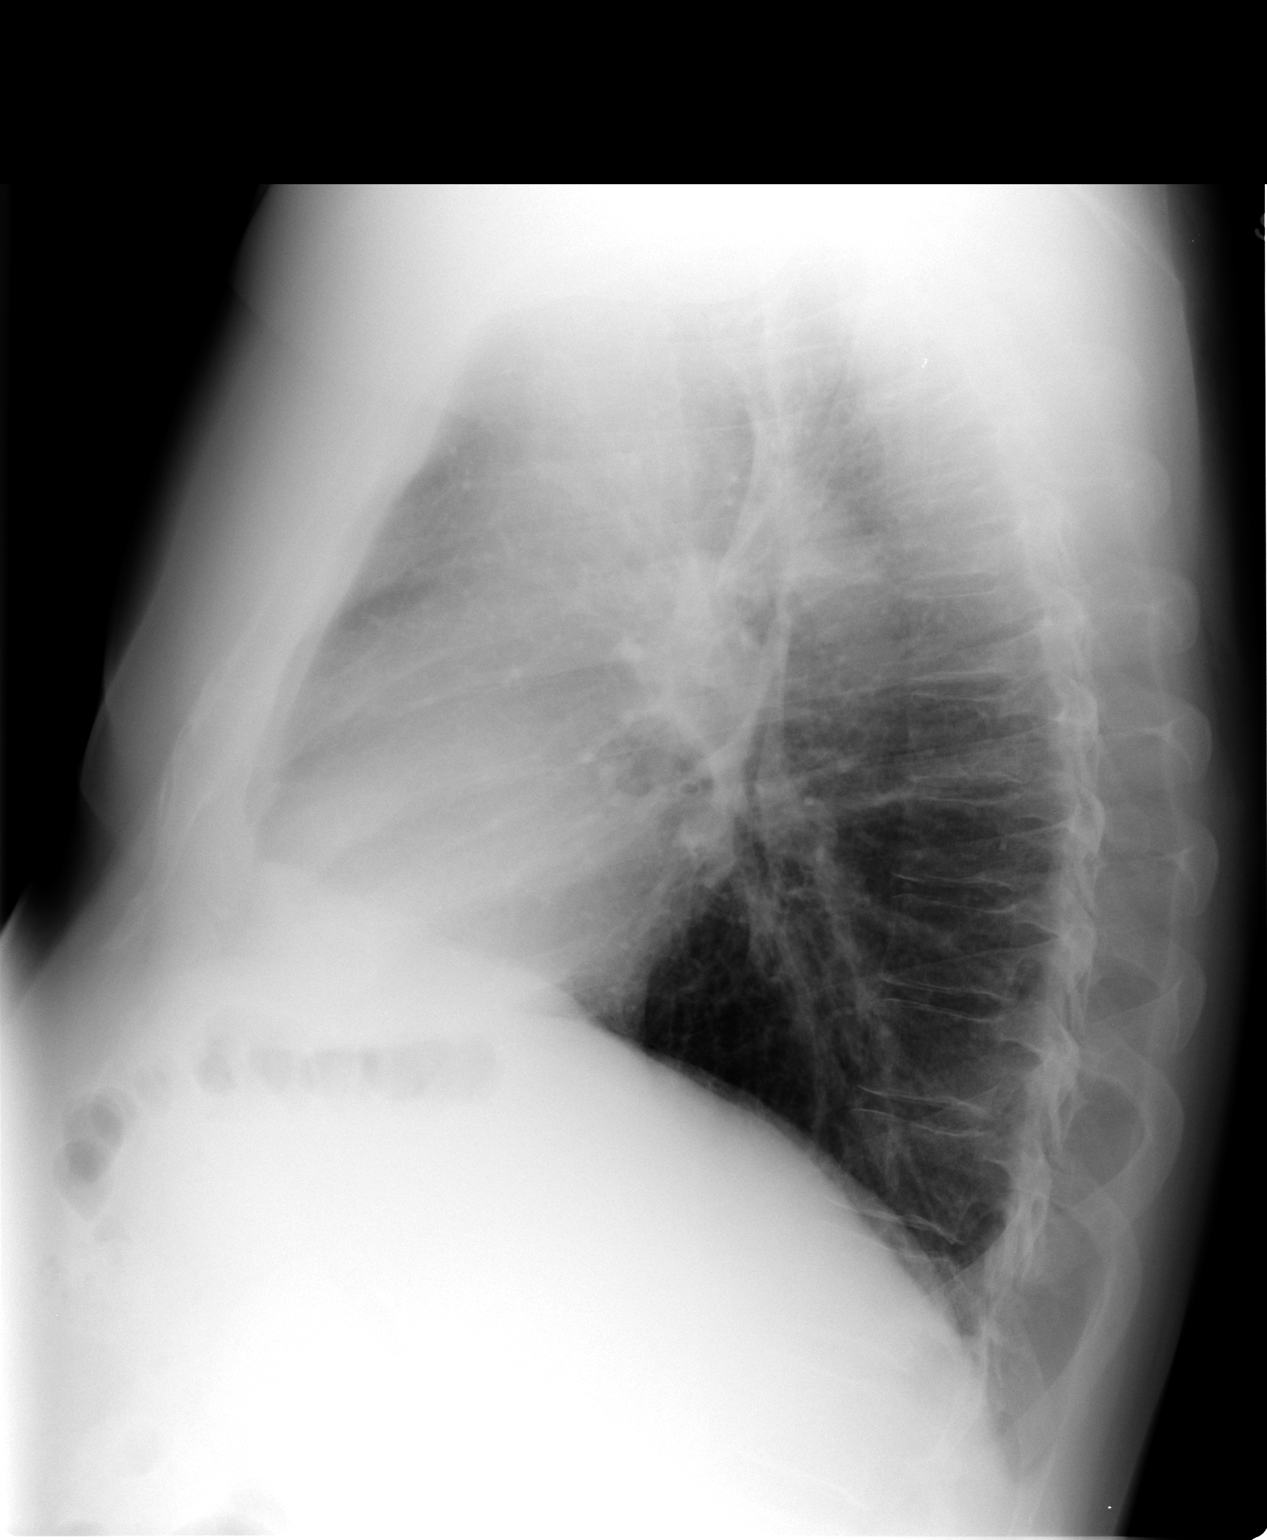

[2 of 2 positions shown; findings below may reference images not displayed]

FINDINGS: Asymmetric increased Mild left lung base linear/interstitial
markings. No pleural effusion or consolidation. No pneumothorax or
pulmonary edema. Normal cardiac size and mediastinal contours.
Visualized tracheal air column is within normal limits. No acute
osseous abnormality identified.
IMPRESSION: Mild asymmetric left lower lobe interstitial markings, nonspecific
but compatible with viral/atypical respiratory infection in this
setting.

Post treatment radiographs recommended to document resolution.

## 2016-05-14 ENCOUNTER — Telehealth: Payer: Self-pay

## 2016-05-19 NOTE — Telephone Encounter (Signed)
Error

## 2016-06-17 ENCOUNTER — Encounter: Payer: Self-pay | Admitting: Physician Assistant

## 2016-06-17 ENCOUNTER — Ambulatory Visit (INDEPENDENT_AMBULATORY_CARE_PROVIDER_SITE_OTHER): Payer: Managed Care, Other (non HMO) | Admitting: Physician Assistant

## 2016-06-17 VITALS — BP 122/72 | HR 76 | Ht 65.0 in | Wt 217.4 lb

## 2016-06-17 DIAGNOSIS — K219 Gastro-esophageal reflux disease without esophagitis: Secondary | ICD-10-CM

## 2016-06-17 DIAGNOSIS — R194 Change in bowel habit: Secondary | ICD-10-CM | POA: Diagnosis not present

## 2016-06-17 DIAGNOSIS — R140 Abdominal distension (gaseous): Secondary | ICD-10-CM | POA: Diagnosis not present

## 2016-06-17 MED ORDER — RANITIDINE HCL 150 MG PO TABS
150.0000 mg | ORAL_TABLET | Freq: Every day | ORAL | 6 refills | Status: DC
Start: 2016-06-17 — End: 2016-07-23

## 2016-06-17 NOTE — Progress Notes (Signed)
Subjective:    Patient ID: Shawn Arellano, male    DOB: January 25, 1956, 60 y.o.   MRN: [id number]  HPI Dorris is a pleasant 60 year old white male known to Dr. Henrene Pastor from previous colonoscopy. He comes in today to discuss, indigestion bloating gas and change in bowel habits. He had undergone colonoscopy in November 2014 for screening and this was a normal exam. He has not had prior EGD Other medical problems include hypertension, BPH and hyperlipidemia. Line Patient states that he had been taking over-the-counter acid blockers for several years and then started on proton X2 or 3 years ago. Says initially this worked well and that he generally doesn't have any heartburn or indigestion symptoms during the daytime but has been having more frequent nighttime symptoms. Says he's getting a lot of bloating and swelling after meals and a lot of reflux symptoms particularly in the evenings and nighttime. He says he's wakened from sleep at times with sour brash. Has also been taking Metamucil at nighttime because he feels that if he gets at all constipated he seems to have more trouble with gas and then reflux worsens because things "backup". With the Metamucil is now having 2-3 bowel movements per day but says he doesn't completely evacuate his bowel all at one time and may have 2-3 separate bowel movements in the first part of the day. No melena or hematochezia. He is not drinking any carbonated beverages generally not using artificial sweeteners. Not aware of any particular trigger foods.  Review of Systems Pertinent positive and negative review of systems were noted in the above HPI section.  All other review of systems was otherwise negative.  Outpatient Encounter Prescriptions as of 06/17/2016  Medication Sig  . aspirin 81 MG tablet Take 81 mg by mouth daily.  Marland Kitchen atorvastatin (LIPITOR) 20 MG tablet Take 1 tablet by mouth at bedtime.  . fish oil-omega-3 fatty acids 1000 MG capsule Take 2 g by mouth daily.  .  Multiple Vitamin (MULTIVITAMIN) tablet Take 1 tablet by mouth daily.  . pantoprazole (PROTONIX) 40 MG tablet Take 1 tablet (40 mg total) by mouth daily. Additional refills require appointment  . Tadalafil (CIALIS PO) Take by mouth.  . terazosin (HYTRIN) 2 MG capsule TAKE 1 CAPSULE BY MOUTH AT BEDTIME  . ranitidine (ZANTAC) 150 MG tablet Take 1 tablet (150 mg total) by mouth at bedtime.   No facility-administered encounter medications on file as of 06/17/2016.    No Known Allergies Patient Active Problem List   Diagnosis Date Noted  . Shortness of breath 10/24/2013  . Chronic kidney disease (CKD) stage G3a/A2, moderately decreased glomerular filtration rate (GFR) between 45-59 mL/min/1.73 square meter and albuminuria creatinine ratio between 30-299 mg/g 03/03/2013  . BPH (benign prostatic hyperplasia) 09/21/2012  . Essential hypertension, benign 09/21/2012  . Hyperlipidemia 06/18/2011  . Elevated blood pressure 06/18/2011  . Reflux esophagitis 05/28/2011  . Prostatic disorder 05/28/2011  . Skin lesion 05/28/2011  . Muscle spasms of head or neck 05/28/2011   Social History   Social History  . Marital status: Married    Spouse name: Neoma Laming  . Number of children: N/A  . Years of education: N/A   Occupational History  . 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    Social History Main Topics  . Smoking status: Never Smoker  . Smokeless tobacco: Never Used  . Alcohol use No  . Drug use: No  . Sexual activity: Yes    Partners: Female  Other Topics Concern  . Not on file   Social History Narrative   Some exercise.     Mr. Hargreaves family history includes Asthma in his paternal grandmother; COPD in his paternal grandmother; Colon cancer (age of onset: 28) in his other.      Objective:    Vitals:   06/17/16 1325  BP: 122/72  Pulse: 76    Physical Exam  well-developed older white male in no acute distress, accompanied by his wife blood pressure 122/72 pulse 76, BMI  36.1. HEENT ;nontraumatic normocephalic EOMI PERRLA sclera anicteric, Cardiovascular; regular rate and rhythm with S1-S2 no murmur or gallop, Pulmonary ;clear bilaterally, Abdomen ;soft nontender nondistended bowel sounds are active there is no palpable mass or hepatosplenomegaly bowel sounds are present, Rectal ;exam not done, Extremities; no clubbing cyanosis or edema skin warm and dry, Neuropsych ;mood and affect appropriate       Assessment & Plan:   #64 60 year old white male with chronic GERD, having breakthrough nighttime symptoms sour brash and postprandial bloating and gas. No prior endoscopic evaluation rule out Barrett's #2 frequent bloating gas and change in bowel habits with 2-3 bowel movements per day, formed-I suspect this is functional and perhaps Protonix use and Metamucil causing increase in bowel movements. #3 hypertension #4 BPH #5 colon cancer screening-up-to-date normal colonoscopy November 2014  Plan; continue Protonix 40 mg by mouth every morning Add Zantac 150 mg daily at bedtime Antireflux regimen to include elevation of the head of the bed and nothing by mouth for 2-3 hours prior to bedtime Switch fiber supplement from Metamucil to Benefiber daily Trial of IBD gard ,2 by mouth twice daily before breakfast and before dinner Low gas diet Will schedule for EGD with Dr. Henrene Pastor to screen for Barrett's. Procedure discussed in detail with the patient and he is agreeable to proceed.   Amy S Esterwood PA-C 06/17/2016   Cc: Hali Marry, *

## 2016-06-17 NOTE — Patient Instructions (Signed)
Switch  Fiber supplement to Benefiber 2 tb daily in glass of water.  Continue Protonix 40 mg, take 1 tab every morning. Add Zantac 150 mg at bedtime. You can get this over the counter.  We  Have given you IBGard capsules. Take  with breakfast and dinner for gas and bloating.  We have given you a low gas diet.  Elevate the head of your bed.   You have been scheduled for an endoscopy. Please follow written instructions given to you at your visit today. If you use inhalers (even only as needed), please bring them with you on the day of your procedure. Your physician has requested that you go to www.startemmi.com and enter the access code given to you at your visit today. This web site gives a general overview about your procedure. However, you should still follow specific instructions given to you by our office regarding your preparation for the procedure.

## 2016-06-18 NOTE — Progress Notes (Signed)
Agree with initial assessment and plans 

## 2016-06-24 ENCOUNTER — Encounter: Payer: Managed Care, Other (non HMO) | Admitting: Gastroenterology

## 2016-07-23 ENCOUNTER — Ambulatory Visit (AMBULATORY_SURGERY_CENTER): Payer: Managed Care, Other (non HMO) | Admitting: Internal Medicine

## 2016-07-23 ENCOUNTER — Encounter: Payer: Self-pay | Admitting: Internal Medicine

## 2016-07-23 VITALS — BP 133/85 | HR 68 | Temp 97.8°F | Resp 10 | Ht 65.0 in | Wt 217.0 lb

## 2016-07-23 DIAGNOSIS — K219 Gastro-esophageal reflux disease without esophagitis: Secondary | ICD-10-CM

## 2016-07-23 MED ORDER — PANTOPRAZOLE SODIUM 40 MG PO TBEC: 40.0000 mg | DELAYED_RELEASE_TABLET | Freq: Two times a day (BID) | ORAL | 3 refills | Status: AC

## 2016-07-23 MED ORDER — SODIUM CHLORIDE 0.9 % IV SOLN: 500.0000 mL | INTRAVENOUS | Status: AC

## 2016-07-23 NOTE — Op Note (Addendum)
[place] Patient Name: Shawn Arellano Procedure Date: 07/23/2016 10:21 AM MRN: [id number] Endoscopist: Docia Chuck. Henrene Pastor , MD Age: 60 Referring MD:  Date of Birth: 11-16-1955 Gender: Male Account #: [id number] Procedure:                Upper GI endoscopy Indications:              Dyspepsia, Suspected esophageal reflux Medicines:                Monitored Anesthesia Care Procedure:                Pre-Anesthesia Assessment:                           - Prior to the procedure, a History and Physical                            was performed, and patient medications and                            allergies were reviewed. The patient's tolerance of                            previous anesthesia was also reviewed. The risks                            and benefits of the procedure and the sedation                            options and risks were discussed with the patient.                            All questions were answered, and informed consent                            was obtained. Prior Anticoagulants: The patient has                            taken no previous anticoagulant or antiplatelet                            agents. ASA Grade Assessment: II - A patient with                            mild systemic disease. After reviewing the risks                            and benefits, the patient was deemed in                            satisfactory condition to undergo the procedure.                           After obtaining informed consent, the endoscope was  passed under direct vision. Throughout the                            procedure, the patient's blood pressure, pulse, and                            oxygen saturations were monitored continuously. The                            Model GIF-HQ190 [id number]) scope was introduced                            through the mouth, and advanced to the second part                            of duodenum. The  upper GI endoscopy was                            accomplished without difficulty. The patient                            tolerated the procedure well. Scope In: Scope Out: Findings:                 The esophagus was normal.                           The stomach was normal.                           The examined duodenum was normal.                           The cardia and gastric fundus were normal on                            retroflexion. Complications:            No immediate complications. Estimated Blood Loss:     Estimated blood loss: none. Impression:               - GERD nonresponsive to current medical regimen                           - Normal EGD. Recommendation:           - Reflux precautions.                           - Weight loss. This is critically important to help                            your symptoms.                           - Increase Protonix to twice daily for symptoms of  ongoing burning.                           - Try MiraLAX 1 dose daily to help with your                            bowels. You may increase or decrease dosage as                            needed. Obtained over-the-counter.                           - Return to GI clinic in 8 weeks. Docia Chuck. Henrene Pastor, MD 07/23/2016 10:39:55 AM This report has been signed electronically.

## 2016-07-23 NOTE — Patient Instructions (Signed)
Impression/Recommendations:  GERD handout given to patient.  Recommend Reflux precautions and weight loss.  Increase Protonix to twice daily for symptoms of ongoing burning.  Try Miralax 1 dose daily to help with bowels.  You may increase or decrease dosage as needed.  Return to GI clinic in 8 weeks.  YOU HAD AN ENDOSCOPIC PROCEDURE TODAY AT [facility]:   Refer to the procedure report that was given to you for any specific questions about what was found during the examination.  If the procedure report does not answer your questions, please call your gastroenterologist to clarify.  If you requested that your care partner not be given the details of your procedure findings, then the procedure report has been included in a sealed envelope for you to review at your convenience later.  YOU SHOULD EXPECT: Some feelings of bloating in the abdomen. Passage of more gas than usual.  Walking can help get rid of the air that was put into your GI tract during the procedure and reduce the bloating. If you had a lower endoscopy (such as a colonoscopy or flexible sigmoidoscopy) you may notice spotting of blood in your stool or on the toilet paper. If you underwent a bowel prep for your procedure, you may not have a normal bowel movement for a few days.  Please Note:  You might notice some irritation and congestion in your nose or some drainage.  This is from the oxygen used during your procedure.  There is no need for concern and it should clear up in a day or so.  SYMPTOMS TO REPORT IMMEDIATELY:  Following upper endoscopy (EGD)  Vomiting of blood or coffee ground material  New chest pain or pain under the shoulder blades  Painful or persistently difficult swallowing  New shortness of breath  Fever of 100F or higher  Black, tarry-looking stools  For urgent or emergent issues, a gastroenterologist can be reached at any hour by calling [phone].   DIET:  We do recommend a small  meal at first, but then you may proceed to your regular diet.  Drink plenty of fluids but you should avoid alcoholic beverages for 24 hours.  ACTIVITY:  You should plan to take it easy for the rest of today and you should NOT DRIVE or use heavy machinery until tomorrow (because of the sedation medicines used during the test).    FOLLOW UP: Our staff will call the number listed on your records the next business day following your procedure to check on you and address any questions or concerns that you may have regarding the information given to you following your procedure. If we do not reach you, we will leave a message.  However, if you are feeling well and you are not experiencing any problems, there is no need to return our call.  We will assume that you have returned to your regular daily activities without incident.  If any biopsies were taken you will be contacted by phone or by letter within the next 1-3 weeks.  Please call us at [phone] if you have not heard about the biopsies in 3 weeks.    SIGNATURES/CONFIDENTIALITY: You and/or your care partner have signed paperwork which will be entered into your electronic medical record.  These signatures attest to the fact that that the information above on your After Visit Summary has been reviewed and is understood.  Full responsibility of the confidentiality of this discharge information lies with you and/or your care-partner.

## 2016-07-23 NOTE — Progress Notes (Signed)
Report to PACU, RN, vss, BBS= Clear.  

## 2016-07-24 ENCOUNTER — Telehealth: Payer: Self-pay

## 2016-07-24 NOTE — Telephone Encounter (Signed)
  Follow up Call-  Call back number 07/23/2016  Post procedure Call Back phone  # [phone]  Permission to leave phone message Yes  Some recent data might be hidden    Patient was called for follow up after his procedure on 07/23/2016. I spoke to Shawn Arellano wife and she reports that [name] has returned to his normal daily activities without any complications.

## 2016-09-18 ENCOUNTER — Ambulatory Visit: Payer: Managed Care, Other (non HMO) | Admitting: Internal Medicine

## 2016-12-02 IMAGING — US US EXTREM LOW VENOUS*L*
1 series · 13 of 24 positions shown · non-contrast
Comparison: None.

CLINICAL DATA: 59-year-old male with left posterolateral knee
palpable cord for the past month. Family history of DVT.



[Series 1: us extrem low venous*left* · 0.07mm/px · 13 of 46 slices shown]
[im 1/46]
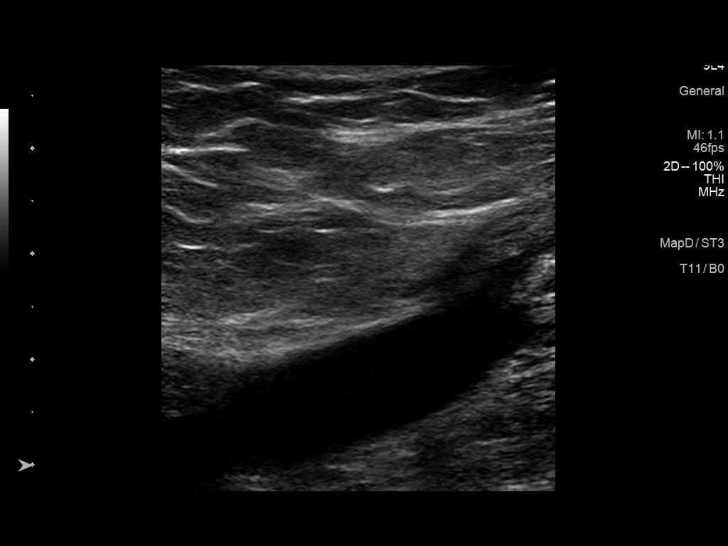
[im 4/46]
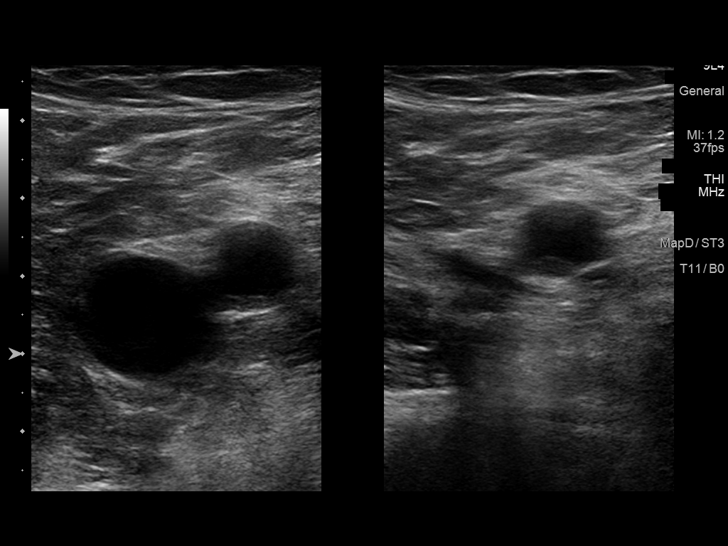
[im 8/46]
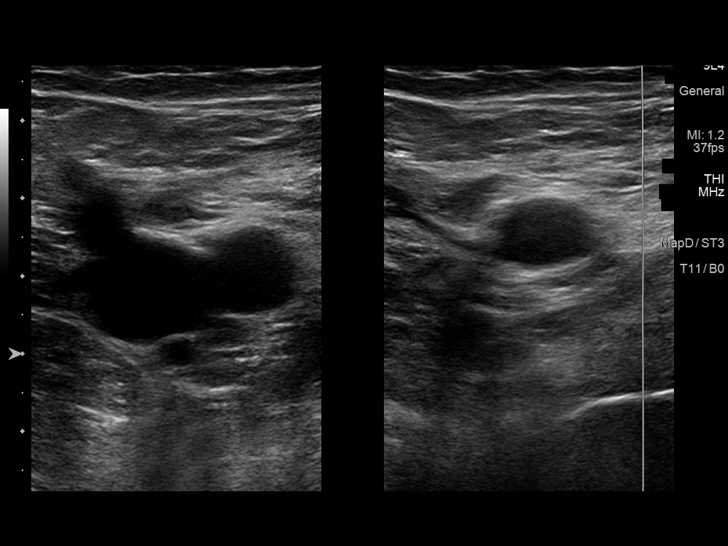
[im 12/46]
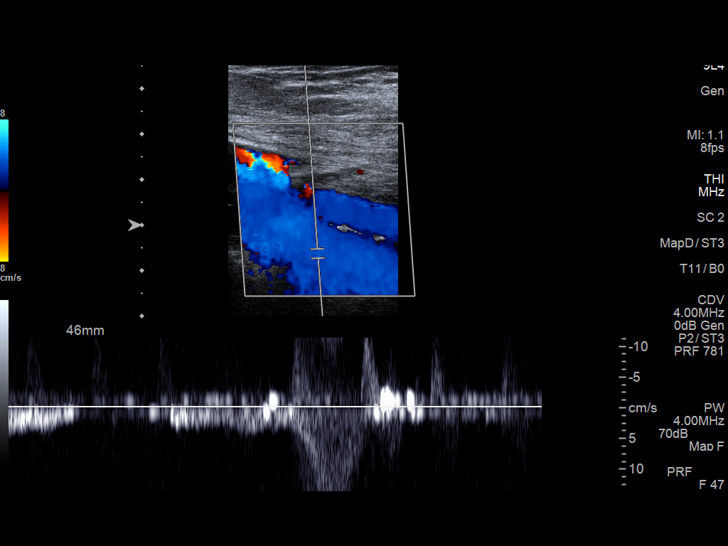
[im 16/46]
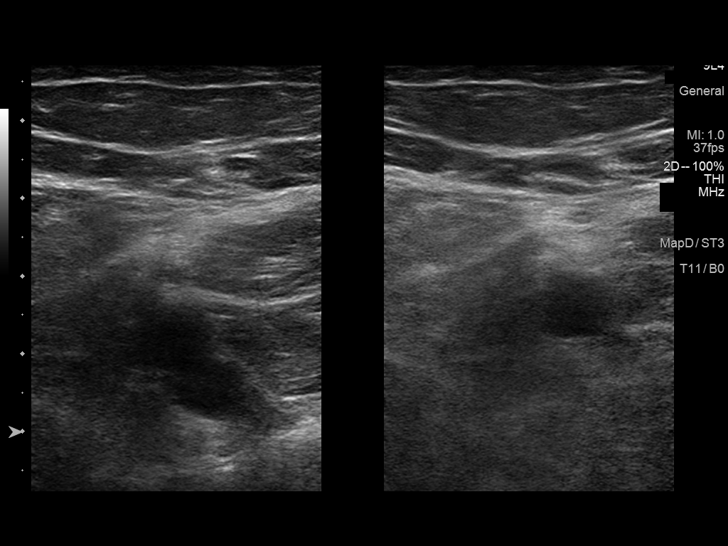
[im 20/46]
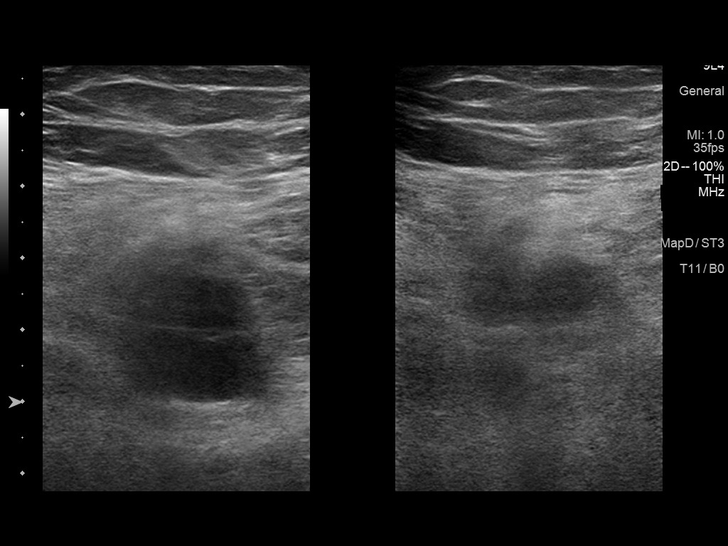
[im 24/46]
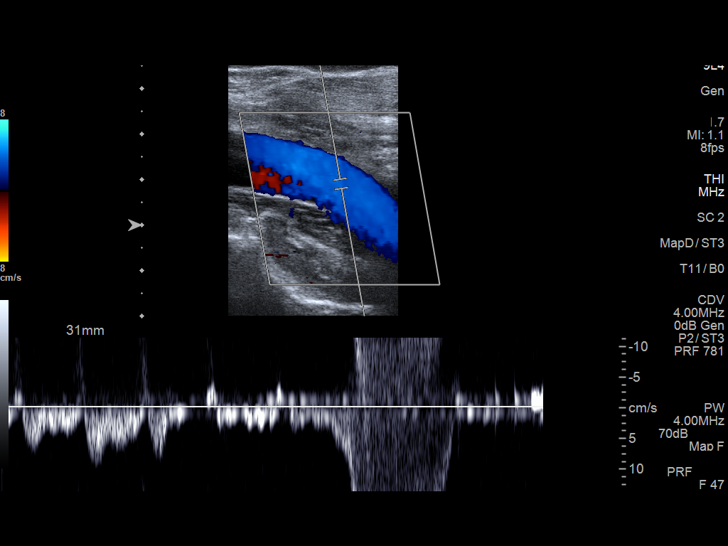
[im 26/46]
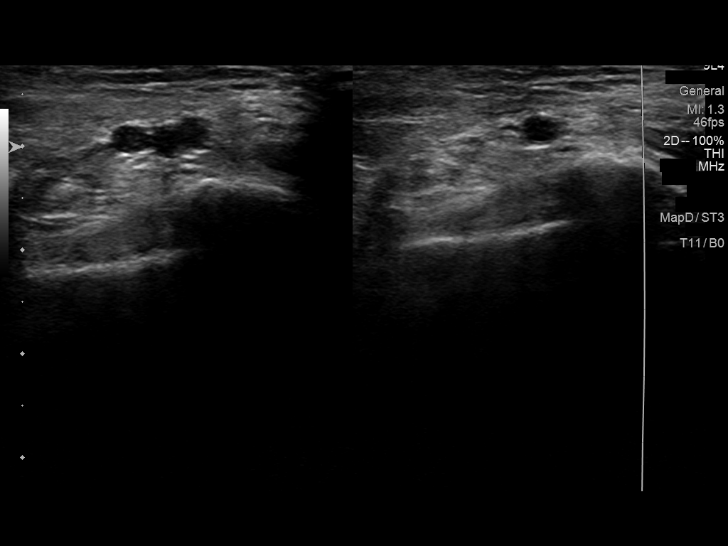
[im 30/46]
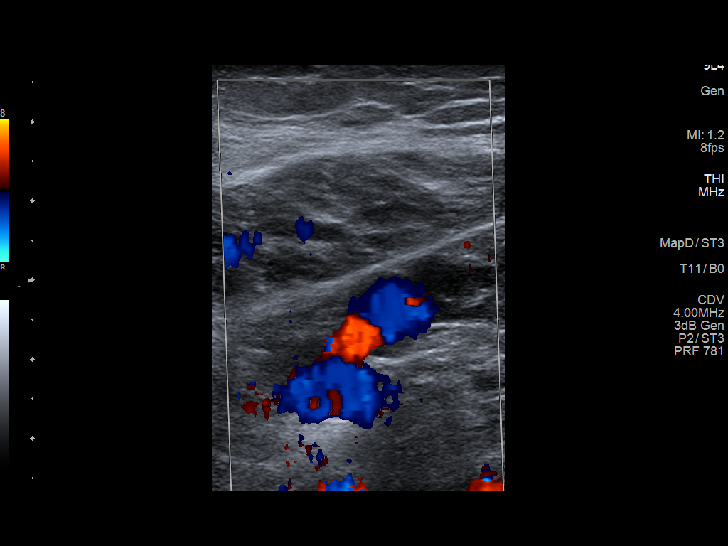
[im 34/46]
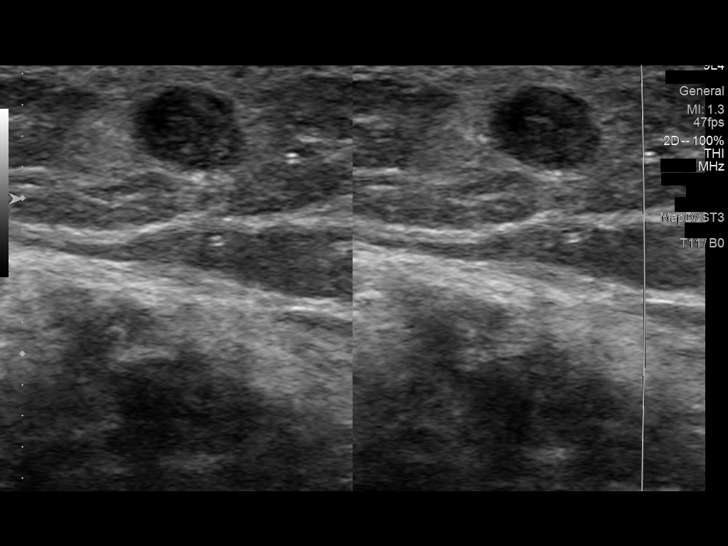
[im 38/46]
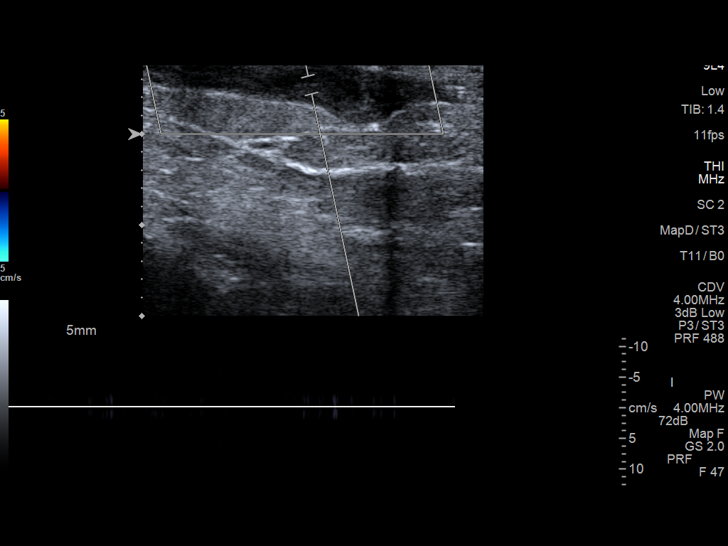
[im 42/46]
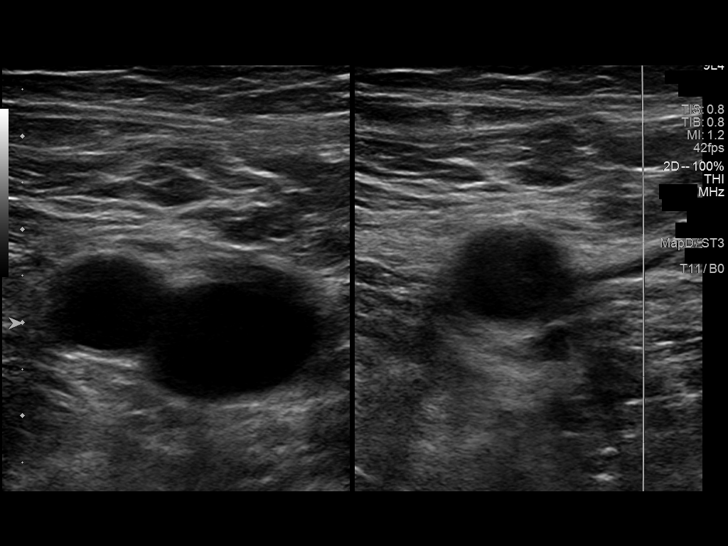
[im 46/46]
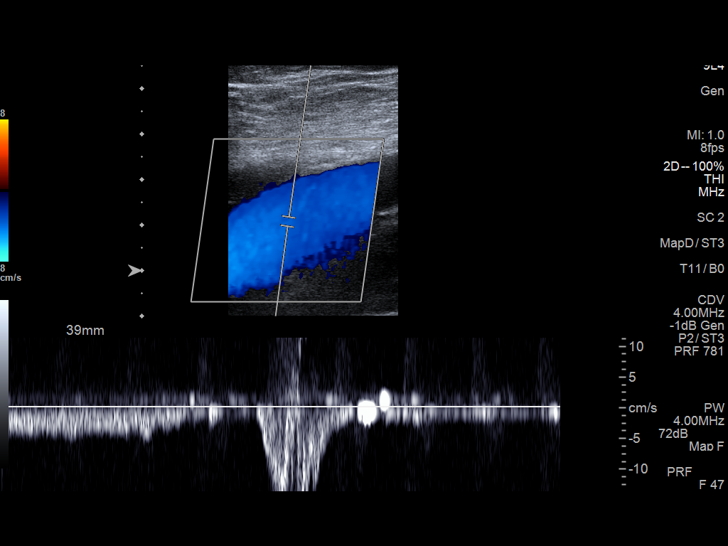

[13 of 24 positions shown; findings below may reference images not displayed]

FINDINGS: Contralateral Common Femoral Vein: Respiratory phasicity is normal
and symmetric with the symptomatic side. No evidence of thrombus.
Normal compressibility.

Common Femoral Vein: No evidence of thrombus. Normal
compressibility, respiratory phasicity and response to augmentation.

Saphenofemoral Junction: No evidence of thrombus. Normal
compressibility and flow on color Doppler imaging.

Profunda Femoral Vein: No evidence of thrombus. Normal
compressibility and flow on color Doppler imaging.

Femoral Vein: No evidence of thrombus. Normal compressibility,
respiratory phasicity and response to augmentation.

Popliteal Vein: No evidence of thrombus. Normal compressibility,
respiratory phasicity and response to augmentation.

Calf Veins: No evidence of thrombus. Normal compressibility and flow
on color Doppler imaging.

Superficial Great Saphenous Vein: The GSV is expanded and filled
with low-level internal echoes. The vessel is not compressible.
There is no evidence of color flow on color Doppler. The vessel is
patent and compressible at the ankle. The patent is patent and
compressible in the upper thigh.

Venous Reflux:  None.

Other Findings:  None.
IMPRESSION: 1. No evidence of deep venous thrombosis.
2. Positive for segmental superficial thrombosis of the great
saphenous vein at the level of the knee.

## 2017-09-27 ENCOUNTER — Telehealth: Payer: Self-pay | Admitting: Family Medicine

## 2017-09-27 NOTE — Telephone Encounter (Signed)
Pt's wife called stating they keep their 82 month old twin granddaughters, who were just diagnosed with the flu. Currently having no symptoms. Requesting tamiflu be sent to [facility] in [facility]. Will route.

## 2017-09-27 NOTE — Telephone Encounter (Signed)
Call patient.  He has not been seen in our office since 2016 and he has not had a kidney function checked since 2014 so I am unable to safely prescribe this medication which has to be adjusted based on renal function.  He is welcome to go to a local urgent care in [facility] if he would like to have prophylactic dosing.

## 2017-09-28 NOTE — Telephone Encounter (Signed)
Patient's wife advised

## 2018-07-06 ENCOUNTER — Encounter: Payer: Self-pay | Admitting: *Deleted

## 2018-07-14 ENCOUNTER — Inpatient Hospital Stay: Payer: Managed Care, Other (non HMO) | Admitting: Family Medicine

## 2023-06-15 ENCOUNTER — Encounter: Payer: Self-pay | Admitting: Internal Medicine
